# Patient Record
Sex: Female | Born: 1980 | Hispanic: No | Marital: Married | Smoking: Never smoker
Health system: Southern US, Community
[De-identification: ages and names within clinical notes are randomized; demographics above are authoritative.]

## PROBLEM LIST (undated history)

## (undated) HISTORY — PX: TONSILLECTOMY AND ADENOIDECTOMY: SHX28

## (undated) HISTORY — PX: UMBILICAL HERNIA REPAIR: SHX196

## (undated) HISTORY — PX: APPENDECTOMY: SHX54

---

## 2007-03-06 ENCOUNTER — Encounter (INDEPENDENT_AMBULATORY_CARE_PROVIDER_SITE_OTHER): Payer: Self-pay | Admitting: Urology

## 2007-03-06 ENCOUNTER — Ambulatory Visit (HOSPITAL_BASED_OUTPATIENT_CLINIC_OR_DEPARTMENT_OTHER): Admission: RE | Admit: 2007-03-06 | Discharge: 2007-03-06 | Payer: Self-pay | Admitting: Urology

## 2010-03-21 HISTORY — DX: Maternal care for unspecified type scar from previous cesarean delivery: O34.219

## 2010-08-03 NOTE — Op Note (Signed)
NAME:  Donna Russo, Donna Russo NO.:  [id number]   MEDICAL RECORD NO.:  [id number]          PATIENT TYPE:  AMB   LOCATION:  [facility]                         FACILITY:  [facility]   PHYSICIAN:  Jamison Neighbor, M.D.  DATE OF BIRTH:  05/06/1980   DATE OF PROCEDURE:  03/06/2007  DATE OF DISCHARGE:                               OPERATIVE REPORT   PREOPERATIVE DIAGNOSIS:  Urgency, frequency, rule out interstitial  cystitis.   POSTOPERATIVE DIAGNOSIS:  Urgency, frequency, rule out interstitial  cystitis.   PROCEDURE:  Cystoscopy, urethral calibration, hydrodistention of the  bladder, Marcaine and Pyridium instillation, Marcaine and Kenalog  injection, bladder biopsy with cauterization.   SURGEON:  Jamison Neighbor, M.D.   ANESTHESIA:  General.   COMPLICATIONS:  None.   DRAINS:  None.   BRIEF HISTORY:  This 30 year old female has had problems with lower  urinary tract symptoms for quite some time.  She has burning with  urination and sometimes some hesitancy.  The patient says that she feels  that she has frequent urinary tract infections despite the fact that she  has used both antibiotic prophylaxis and takes antibiotics regularly for  adult acne.  The patient says that when she is on antibiotics, she feels  better but she has also stated that she is not sure that one of the  antibiotics worked particularly well for her.  It is not at all clear if  she actually ever had recurrent urinary tract infections.  She was seen  by Dr. Gala Lewandowsky over at [facility], who told her that he thought she had  pelvic floor dysfunction and felt that she had had problems with  emptying and suggest that she may want to be tested for the InterStim.  She is convinced that she might have interstitial cystitis and would  like to have that evaluated.  She understands the risks and benefits of  the procedure and gave full informed consent.   PROCEDURE:  After successful induction of general anesthesia  the patient  was placed in the dorsal lithotomy position, prepped with Betadine and  draped in the usual sterile fashion.  Careful bimanual examination  revealed no cystocele, rectocele, enterocele.  There were no masses on  bimanual exam.  The uterus was palpably normal and in normal position.  The urethra was calibrated 32-French with female urethral sounds with no  evidence of stenosis or stricture.  The cystoscope was inserted.  The  bladder was carefully inspected.  No tumors or stones could be seen.  Both ureteral orifices were normal in configuration and location.  Hydrodistention of the bladder was performed.  The bladder was distended  at a pressure of 100 cm of water for 5 minutes.  When the bladder was  drained, minimal glomerulations could be seen.  Bladder capacity of 1100  mL was felt to be normal.  Bladder biopsy was done, will be sent for  mast cell analysis.  The bladder biopsy site was cauterized.  A mixture  of Marcaine and Pyridium was left in the bladder.  Marcaine and Kenalog  were injected periurethrally.  The patient tolerated the procedure well,  was taken to recovery in good condition.  Unless the patient has  remarkable response to the hydrodistention or has  significant findings on the biopsy, this would really be felt to be not  consistent with interstitial cystitis and she will be told she really  needs to be treated somewhat empirically for frequency and urgency.  Options would include anticholinergic therapy, urinary analgesics,  physical therapy, InterStim and possibly Botox if all that fails.      Jamison Neighbor, M.D.  Electronically Signed     RJE/MEDQ  D:  03/06/2007  T:  03/07/2007  Job:  [id number]

## 2010-12-24 LAB — POCT PREGNANCY, URINE: Preg Test, Ur: NEGATIVE

## 2011-02-22 DIAGNOSIS — O34219 Maternal care for unspecified type scar from previous cesarean delivery: Secondary | ICD-10-CM

## 2011-03-21 DIAGNOSIS — I4719 Other supraventricular tachycardia: Secondary | ICD-10-CM

## 2011-03-21 HISTORY — DX: Other supraventricular tachycardia: I47.19

## 2013-12-11 DIAGNOSIS — N3010 Interstitial cystitis (chronic) without hematuria: Secondary | ICD-10-CM

## 2013-12-11 HISTORY — DX: Interstitial cystitis (chronic) without hematuria: N30.10

## 2014-07-03 DIAGNOSIS — Z87440 Personal history of urinary (tract) infections: Secondary | ICD-10-CM

## 2014-07-03 HISTORY — DX: Personal history of urinary (tract) infections: Z87.440

## 2015-08-14 ENCOUNTER — Other Ambulatory Visit: Payer: Self-pay | Admitting: Obstetrics and Gynecology

## 2015-08-14 ENCOUNTER — Other Ambulatory Visit (HOSPITAL_COMMUNITY)
Admission: RE | Admit: 2015-08-14 | Discharge: 2015-08-14 | Disposition: A | Discharge status: Home / Self Care | Payer: Self-pay | Source: Ambulatory Visit | Attending: Obstetrics and Gynecology | Admitting: Obstetrics and Gynecology

## 2015-08-14 DIAGNOSIS — Z01419 Encounter for gynecological examination (general) (routine) without abnormal findings: Secondary | ICD-10-CM | POA: Insufficient documentation

## 2015-08-14 DIAGNOSIS — Z1151 Encounter for screening for human papillomavirus (HPV): Secondary | ICD-10-CM | POA: Insufficient documentation

## 2015-08-18 LAB — CYTOLOGY - PAP

## 2015-09-20 ENCOUNTER — Encounter (HOSPITAL_COMMUNITY): Payer: Self-pay

## 2015-09-20 ENCOUNTER — Emergency Department (HOSPITAL_COMMUNITY)
Admission: EM | Admit: 2015-09-20 | Discharge: 2015-09-20 | Disposition: A | Discharge status: Home / Self Care | Payer: Self-pay | Attending: Emergency Medicine | Admitting: Emergency Medicine

## 2015-09-20 DIAGNOSIS — R197 Diarrhea, unspecified: Secondary | ICD-10-CM | POA: Insufficient documentation

## 2015-09-20 DIAGNOSIS — R112 Nausea with vomiting, unspecified: Secondary | ICD-10-CM | POA: Insufficient documentation

## 2015-09-20 LAB — CBC
HCT: 42 % (ref 36.0–46.0)
HEMOGLOBIN: 14.4 g/dL (ref 12.0–15.0)
MCH: 30.1 pg (ref 26.0–34.0)
MCHC: 34.3 g/dL (ref 30.0–36.0)
MCV: 87.9 fL (ref 78.0–100.0)
PLATELETS: 178 10*3/uL (ref 150–400)
RBC: 4.78 MIL/uL (ref 3.87–5.11)
RDW: 13.3 % (ref 11.5–15.5)
WBC: 9.4 10*3/uL (ref 4.0–10.5)

## 2015-09-20 LAB — COMPREHENSIVE METABOLIC PANEL
ALK PHOS: 46 U/L (ref 38–126)
ALT: 21 U/L (ref 14–54)
ANION GAP: 10 (ref 5–15)
AST: 26 U/L (ref 15–41)
Albumin: 4.8 g/dL (ref 3.5–5.0)
BUN: 12 mg/dL (ref 6–20)
CALCIUM: 9.7 mg/dL (ref 8.9–10.3)
CO2: 23 mmol/L (ref 22–32)
CREATININE: 0.82 mg/dL (ref 0.44–1.00)
Chloride: 107 mmol/L (ref 101–111)
Glucose, Bld: 128 mg/dL — ABNORMAL HIGH (ref 65–99)
Potassium: 3.8 mmol/L (ref 3.5–5.1)
Sodium: 140 mmol/L (ref 135–145)
Total Bilirubin: 0.5 mg/dL (ref 0.3–1.2)
Total Protein: 8.1 g/dL (ref 6.5–8.1)

## 2015-09-20 LAB — LIPASE, BLOOD: LIPASE: 42 U/L (ref 11–51)

## 2015-09-20 LAB — I-STAT BETA HCG BLOOD, ED (MC, WL, AP ONLY)

## 2015-09-20 MED ORDER — OMEPRAZOLE 20 MG PO CPDR: 20.0000 mg | DELAYED_RELEASE_CAPSULE | Freq: Every day | ORAL | Status: DC

## 2015-09-20 MED ORDER — HYDROCODONE-ACETAMINOPHEN 5-325 MG PO TABS: 2.0000 | ORAL_TABLET | ORAL | Status: DC | PRN

## 2015-09-20 MED ORDER — SODIUM CHLORIDE 0.9 % IV BOLUS (SEPSIS)
1000.0000 mL | Freq: Once | INTRAVENOUS | Status: AC
  Administered 2015-09-20: 1000 mL via INTRAVENOUS

## 2015-09-20 MED ORDER — HYDROMORPHONE HCL 1 MG/ML IJ SOLN
0.5000 mg | Freq: Once | INTRAMUSCULAR | Status: AC
  Administered 2015-09-20: 0.5 mg via INTRAVENOUS
  Filled 2015-09-20: qty 1

## 2015-09-20 MED ORDER — ONDANSETRON HCL 4 MG/2ML IJ SOLN
4.0000 mg | Freq: Once | INTRAMUSCULAR | Status: AC
  Administered 2015-09-20: 4 mg via INTRAVENOUS
  Filled 2015-09-20: qty 2

## 2015-09-20 MED ORDER — FAMOTIDINE IN NACL 20-0.9 MG/50ML-% IV SOLN
20.0000 mg | Freq: Once | INTRAVENOUS | Status: AC
  Administered 2015-09-20: 20 mg via INTRAVENOUS
  Filled 2015-09-20: qty 50

## 2015-09-20 MED ORDER — PROMETHAZINE HCL 25 MG/ML IJ SOLN
25.0000 mg | Freq: Once | INTRAMUSCULAR | Status: AC
  Administered 2015-09-20: 25 mg via INTRAVENOUS
  Filled 2015-09-20: qty 1

## 2015-09-20 MED ORDER — PROMETHAZINE HCL 25 MG PO TABS
25.0000 mg | ORAL_TABLET | Freq: Four times a day (QID) | ORAL | Status: DC | PRN
Start: 2015-09-20 — End: 2015-09-24

## 2015-09-20 NOTE — ED Provider Notes (Signed)
CSN: [id number]     Arrival date & time 09/20/15  0536 History   First MD Initiated Contact with Patient 09/20/15 0602     Chief Complaint  Patient presents with  . Abdominal Pain  . Emesis  . Diarrhea   HPI Comments: 35 year old female with no significant PMH presents with abdominal pain, N/V/D for the past 2 days. She states the N/V started after she at lunch Friday. She thought she may have food poisoning however her symptoms continued. The pain is in the epigastric area and gets better after she vomits however soon returns. It is a constant, achy, dull pain. 8/10. She reports associated decreased urine due to inability to tolerate PO. She denies fever, chest pain, SOB, flank pain, blood in vomit or stool, irritative voiding symptoms. She states she has been using Phenergan suppositories and Zofran prescribed by her PCP with no relief.   Patient is a 35 y.o. female presenting with abdominal pain, vomiting, and diarrhea.  Abdominal Pain Associated symptoms: diarrhea, nausea and vomiting   Associated symptoms: no chest pain, no chills, no dysuria, no fever, no hematuria and no shortness of breath   Emesis Associated symptoms: abdominal pain and diarrhea   Associated symptoms: no chills   Diarrhea Associated symptoms: abdominal pain and vomiting   Associated symptoms: no chills and no fever     History reviewed. No pertinent past medical history. Past Surgical History  Procedure Laterality Date  . Appendectomy     No family history on file. Social History  Substance Use Topics  . Smoking status: Never Smoker   . Smokeless tobacco: None  . Alcohol Use: No   OB History    No data available     Review of Systems  Constitutional: Negative for fever and chills.  Respiratory: Negative for shortness of breath.   Cardiovascular: Negative for chest pain.  Gastrointestinal: Positive for nausea, vomiting, abdominal pain and diarrhea. Negative for blood in stool.  Genitourinary:  Positive for decreased urine volume. Negative for dysuria, hematuria and flank pain.  All other systems reviewed and are negative.   Allergies  Ciprofloxacin  Home Medications   Prior to Admission medications   Not on File   BP 121/86 mmHg  Pulse 60  Temp(Src) 98.4 F (36.9 C) (Oral)  Resp 21  Ht 5\' 2"  (1.575 m)  Wt 55.339 kg  BMI 22.31 kg/m2  SpO2 100%  LMP 09/07/2015 (Approximate)   Physical Exam  Constitutional: She is oriented to person, place, and time. She appears well-developed and well-nourished. No distress.  Appears fatigued  HENT:  Head: Normocephalic and atraumatic.  Eyes: Conjunctivae are normal. Pupils are equal, round, and reactive to light. Right eye exhibits no discharge. Left eye exhibits no discharge. No scleral icterus.  Neck: Normal range of motion.  Cardiovascular: Normal rate and regular rhythm.  Exam reveals no gallop and no friction rub.   No murmur heard. Pulmonary/Chest: Effort normal and breath sounds normal. No respiratory distress. She has no wheezes. She has no rales. She exhibits no tenderness.  Abdominal: Soft. Bowel sounds are normal. She exhibits no distension and no mass. There is tenderness. There is no rebound, no guarding and no CVA tenderness.  Epigastric tenderness  Neurological: She is alert and oriented to person, place, and time.  Skin: Skin is warm and dry. She is not diaphoretic.  Psychiatric: She has a normal mood and affect. Her behavior is normal.    ED Course  Procedures (including critical  care time) Labs Review Labs Reviewed  COMPREHENSIVE METABOLIC PANEL - Abnormal; Notable for the following:    Glucose, Bld 128 (*)    All other components within normal limits  LIPASE, BLOOD  CBC  I-STAT BETA HCG BLOOD, ED ([facility], [facility], [facility] ONLY)    Imaging Review No results found. I have personally reviewed and evaluated these images and lab results as part of my medical decision-making.   EKG Interpretation None     Meds  given in ED:  Medications  ondansetron (ZOFRAN) injection 4 mg (4 mg Intravenous Given 09/20/15 0623)  sodium chloride 0.9 % bolus 1,000 mL (0 mLs Intravenous Stopped 09/20/15 0730)  HYDROmorphone (DILAUDID) injection 0.5 mg (0.5 mg Intravenous Given 09/20/15 0637)  sodium chloride 0.9 % bolus 1,000 mL (0 mLs Intravenous Stopped 09/20/15 1159)  famotidine (PEPCID) IVPB 20 mg premix (0 mg Intravenous Stopped 09/20/15 0907)  HYDROmorphone (DILAUDID) injection 0.5 mg (0.5 mg Intravenous Given 09/20/15 0857)  ondansetron (ZOFRAN) injection 4 mg (4 mg Intravenous Given 09/20/15 0857)  promethazine (PHENERGAN) injection 25 mg (25 mg Intravenous Given 09/20/15 1033)    Discharge Medication List as of 09/20/2015 11:33 AM    START taking these medications   Details  HYDROcodone-acetaminophen (NORCO/VICODIN) 5-325 MG tablet Take 2 tablets by mouth every 4 (four) hours as needed., Starting 09/20/2015, Until Discontinued, Print    omeprazole (PRILOSEC) 20 MG capsule Take 1 capsule (20 mg total) by mouth daily., Starting 09/20/2015, Until Discontinued, Print    promethazine (PHENERGAN) 25 MG tablet Take 1 tablet (25 mg total) by mouth every 6 (six) hours as needed for nausea or vomiting., Starting 09/20/2015, Until Discontinued, Print         MDM   Final diagnoses:  Nausea vomiting and diarrhea   35 year old female presents with acute abdominal pain, N/V/D. Patient appears very uncomfortable upon initial assessment and does not tolerate abdominal exam. IVF, Zofran, Dilaudid given with significant relief. Patient has moderate epigastric tenderness on repeat exam. Likely this is viral illness which has been exacerbated by repeated episodes of dry heaving and vomiting. CBC and CMP are unremarkable. Lipase normal. UA clean.   On recheck patient states her pain and nausea are worsening. Repeat dose of Dilaudid and Zofran given. Pepcid given for GI upset.  2nd recheck patient states pain is better but nausea is still  there. Phenergan given with good relief. Patient tolerate PO challenge. Patient is safe for d/c. Will d/c with norco, prilosec, and phenergan. Patient is NAD, non-toxic, with stable VS. Patient is informed of clinical course, understands medical decision making process, and agrees with plan. Opportunity for questions provided and all questions answered. Return precautions given.     Bethel BornKelly Marie Coren Sagan, PA-C 09/21/15 [id number]  Melene Planan Floyd, DO 09/22/15 [id number]

## 2015-09-20 NOTE — ED Notes (Signed)
Patient c/o vomiting and diarrhea x2 days.  Patient states is not able to keep food or fluids down.  Patient c/o abdominal pain just below her rib cage.  Patient denies blood in emesis or stool.  Patient rates pain 8/10

## 2015-09-21 ENCOUNTER — Emergency Department (HOSPITAL_COMMUNITY): Payer: Self-pay

## 2015-09-21 ENCOUNTER — Inpatient Hospital Stay (HOSPITAL_COMMUNITY)
Admission: EM | Admit: 2015-09-21 | Discharge: 2015-09-24 | DRG: 445 | Disposition: A | Discharge status: Home / Self Care | Payer: Self-pay | Attending: Family Medicine | Admitting: Family Medicine

## 2015-09-21 ENCOUNTER — Encounter (HOSPITAL_COMMUNITY): Payer: Self-pay | Admitting: *Deleted

## 2015-09-21 DIAGNOSIS — R1011 Right upper quadrant pain: Secondary | ICD-10-CM

## 2015-09-21 DIAGNOSIS — I471 Supraventricular tachycardia, unspecified: Secondary | ICD-10-CM | POA: Diagnosis present

## 2015-09-21 DIAGNOSIS — Z881 Allergy status to other antibiotic agents status: Secondary | ICD-10-CM

## 2015-09-21 DIAGNOSIS — Z79899 Other long term (current) drug therapy: Secondary | ICD-10-CM

## 2015-09-21 DIAGNOSIS — K920 Hematemesis: Secondary | ICD-10-CM

## 2015-09-21 DIAGNOSIS — R112 Nausea with vomiting, unspecified: Secondary | ICD-10-CM | POA: Diagnosis present

## 2015-09-21 DIAGNOSIS — K5900 Constipation, unspecified: Secondary | ICD-10-CM | POA: Diagnosis present

## 2015-09-21 DIAGNOSIS — R600 Localized edema: Secondary | ICD-10-CM | POA: Diagnosis present

## 2015-09-21 DIAGNOSIS — K819 Cholecystitis, unspecified: Principal | ICD-10-CM | POA: Diagnosis present

## 2015-09-21 DIAGNOSIS — R1013 Epigastric pain: Secondary | ICD-10-CM

## 2015-09-21 HISTORY — DX: Epigastric pain: R10.13

## 2015-09-21 HISTORY — DX: Hematemesis: K92.0

## 2015-09-21 HISTORY — DX: Constipation, unspecified: K59.00

## 2015-09-21 HISTORY — DX: Nausea with vomiting, unspecified: R11.2

## 2015-09-21 HISTORY — DX: Supraventricular tachycardia, unspecified: I47.10

## 2015-09-21 LAB — COMPREHENSIVE METABOLIC PANEL
ALK PHOS: 44 U/L (ref 38–126)
ALT: 24 U/L (ref 14–54)
AST: 28 U/L (ref 15–41)
Albumin: 5.1 g/dL — ABNORMAL HIGH (ref 3.5–5.0)
Anion gap: 8 (ref 5–15)
BUN: 8 mg/dL (ref 6–20)
CALCIUM: 9.4 mg/dL (ref 8.9–10.3)
CO2: 28 mmol/L (ref 22–32)
CREATININE: 0.87 mg/dL (ref 0.44–1.00)
Chloride: 103 mmol/L (ref 101–111)
Glucose, Bld: 91 mg/dL (ref 65–99)
Potassium: 3.7 mmol/L (ref 3.5–5.1)
Sodium: 139 mmol/L (ref 135–145)
TOTAL PROTEIN: 8.1 g/dL (ref 6.5–8.1)
Total Bilirubin: 0.6 mg/dL (ref 0.3–1.2)

## 2015-09-21 LAB — CBC WITH DIFFERENTIAL/PLATELET
Basophils Absolute: 0 10*3/uL (ref 0.0–0.1)
Basophils Relative: 0 %
EOS PCT: 1 %
Eosinophils Absolute: 0.1 10*3/uL (ref 0.0–0.7)
HCT: 40.9 % (ref 36.0–46.0)
HEMOGLOBIN: 14.7 g/dL (ref 12.0–15.0)
LYMPHS ABS: 1.2 10*3/uL (ref 0.7–4.0)
LYMPHS PCT: 17 %
MCH: 30.9 pg (ref 26.0–34.0)
MCHC: 35.9 g/dL (ref 30.0–36.0)
MCV: 86.1 fL (ref 78.0–100.0)
MONOS PCT: 6 %
Monocytes Absolute: 0.4 10*3/uL (ref 0.1–1.0)
NEUTROS PCT: 76 %
Neutro Abs: 5.3 10*3/uL (ref 1.7–7.7)
Platelets: 158 10*3/uL (ref 150–400)
RBC: 4.75 MIL/uL (ref 3.87–5.11)
RDW: 13.1 % (ref 11.5–15.5)
WBC: 6.9 10*3/uL (ref 4.0–10.5)

## 2015-09-21 LAB — POC OCCULT BLOOD, ED: FECAL OCCULT BLD: NEGATIVE

## 2015-09-21 LAB — LIPASE, BLOOD: LIPASE: 33 U/L (ref 11–51)

## 2015-09-21 MED ORDER — ONDANSETRON HCL 4 MG/2ML IJ SOLN
4.0000 mg | Freq: Four times a day (QID) | INTRAMUSCULAR | Status: DC | PRN
  Administered 2015-09-21 – 2015-09-23 (×5): 4 mg via INTRAVENOUS
  Filled 2015-09-21 (×6): qty 2

## 2015-09-21 MED ORDER — MORPHINE SULFATE (PF) 4 MG/ML IV SOLN
4.0000 mg | INTRAVENOUS | Status: DC | PRN
  Administered 2015-09-21 – 2015-09-24 (×9): 4 mg via INTRAVENOUS
  Filled 2015-09-21 (×10): qty 1

## 2015-09-21 MED ORDER — DIATRIZOATE MEGLUMINE & SODIUM 66-10 % PO SOLN: 15.0000 mL | Freq: Once | ORAL | Status: DC

## 2015-09-21 MED ORDER — ONDANSETRON HCL 4 MG/2ML IJ SOLN
4.0000 mg | Freq: Once | INTRAMUSCULAR | Status: AC
  Administered 2015-09-21: 4 mg via INTRAVENOUS
  Filled 2015-09-21: qty 2

## 2015-09-21 MED ORDER — FENTANYL CITRATE (PF) 100 MCG/2ML IJ SOLN
50.0000 ug | Freq: Once | INTRAMUSCULAR | Status: AC
  Administered 2015-09-21: 50 ug via INTRAVENOUS
  Filled 2015-09-21: qty 2

## 2015-09-21 MED ORDER — ACETAMINOPHEN 650 MG RE SUPP: 650.0000 mg | Freq: Four times a day (QID) | RECTAL | Status: DC | PRN

## 2015-09-21 MED ORDER — ONDANSETRON HCL 4 MG PO TABS: 4.0000 mg | ORAL_TABLET | Freq: Four times a day (QID) | ORAL | Status: DC | PRN

## 2015-09-21 MED ORDER — FAMOTIDINE IN NACL 20-0.9 MG/50ML-% IV SOLN
20.0000 mg | Freq: Two times a day (BID) | INTRAVENOUS | Status: DC
  Administered 2015-09-21: 20 mg via INTRAVENOUS
  Filled 2015-09-21: qty 50

## 2015-09-21 MED ORDER — ENOXAPARIN SODIUM 40 MG/0.4ML ~~LOC~~ SOLN
40.0000 mg | SUBCUTANEOUS | Status: DC
Start: 2015-09-21 — End: 2015-09-24
  Administered 2015-09-21 – 2015-09-23 (×3): 40 mg via SUBCUTANEOUS
  Filled 2015-09-21 (×3): qty 0.4

## 2015-09-21 MED ORDER — FAMOTIDINE IN NACL 20-0.9 MG/50ML-% IV SOLN
20.0000 mg | Freq: Two times a day (BID) | INTRAVENOUS | Status: DC
Start: 2015-09-22 — End: 2015-09-24
  Administered 2015-09-22 – 2015-09-24 (×5): 20 mg via INTRAVENOUS
  Filled 2015-09-21 (×5): qty 50

## 2015-09-21 MED ORDER — POLYETHYLENE GLYCOL 3350 17 G PO PACK: 17.0000 g | PACK | Freq: Every day | ORAL | Status: DC | PRN

## 2015-09-21 MED ORDER — SODIUM CHLORIDE 0.9 % IV SOLN
INTRAVENOUS | Status: DC
  Administered 2015-09-21: 17:00:00 via INTRAVENOUS

## 2015-09-21 MED ORDER — ACETAMINOPHEN 325 MG PO TABS
650.0000 mg | ORAL_TABLET | Freq: Four times a day (QID) | ORAL | Status: DC | PRN
  Administered 2015-09-22: 650 mg via ORAL
  Filled 2015-09-21 (×2): qty 2

## 2015-09-21 MED ORDER — SODIUM CHLORIDE 0.9 % IV BOLUS (SEPSIS)
1000.0000 mL | Freq: Once | INTRAVENOUS | Status: AC
  Administered 2015-09-21: 1000 mL via INTRAVENOUS

## 2015-09-21 MED ORDER — IOPAMIDOL (ISOVUE-300) INJECTION 61%
100.0000 mL | Freq: Once | INTRAVENOUS | Status: AC | PRN
  Administered 2015-09-21: 100 mL via INTRAVENOUS

## 2015-09-21 MED ORDER — SODIUM CHLORIDE 0.9 % IV SOLN
INTRAVENOUS | Status: DC
  Administered 2015-09-21: 20:00:00 via INTRAVENOUS

## 2015-09-21 NOTE — ED Notes (Signed)
Patient transported to Ultrasound 

## 2015-09-21 NOTE — ED Notes (Signed)
Report given-transfer to 3rd floor 

## 2015-09-21 NOTE — ED Provider Notes (Signed)
CSN: [id number]     Arrival date & time 09/21/15  1237 History   First MD Initiated Contact with Patient 09/21/15 1439     Chief Complaint  Patient presents with  . Emesis  . Abdominal Pain      HPI  Expand All Collapse All   Pt complains of emesis and abdominal pain since Friday. Pt was seen at [facility] yesterday and sent home with phenergan and hydrocodone, which she states she has been unable to keep down since this morning. Pt states she has coffee ground emesis this morning, pt states she was unable to keep nexium down this morning.        History reviewed. No pertinent past medical history. Past Surgical History  Procedure Laterality Date  . Appendectomy     No family history on file. Social History  Substance Use Topics  . Smoking status: Never Smoker   . Smokeless tobacco: None  . Alcohol Use: No   OB History    No data available     Review of Systems  Gastrointestinal: Positive for nausea, vomiting and abdominal pain.  All other systems reviewed and are negative.     Allergies  Ciprofloxacin  Home Medications   Prior to Admission medications   Medication Sig Start Date End Date Taking? Authorizing Provider  HYDROcodone-acetaminophen (NORCO/VICODIN) 5-325 MG tablet Take 2 tablets by mouth every 4 (four) hours as needed. Patient taking differently: Take 2 tablets by mouth every 4 (four) hours as needed for moderate pain or severe pain.  09/20/15  Yes Bethel BornKelly Marie Gekas, PA-C  hydrOXYzine (ATARAX/VISTARIL) 25 MG tablet Take 25 mg by mouth at bedtime as needed for nausea.   Yes Historical Provider, MD  nitrofurantoin (MACRODANTIN) 25 MG capsule Take 25 mg by mouth at bedtime.   Yes Historical Provider, MD  ondansetron (ZOFRAN) 4 MG tablet Take 4 mg by mouth every 8 (eight) hours as needed for nausea or vomiting.   Yes Historical Provider, MD  promethazine (PHENERGAN) 25 MG tablet Take 1 tablet (25 mg total) by mouth every 6 (six) hours as needed for nausea or  vomiting. 09/20/15  Yes Bethel BornKelly Marie Gekas, PA-C  omeprazole (PRILOSEC) 20 MG capsule Take 1 capsule (20 mg total) by mouth daily. Patient not taking: Reported on 09/21/2015 09/20/15   Bethel BornKelly Marie Gekas, PA-C   BP 118/78 mmHg  Pulse 64  Temp(Src) 98.6 F (37 C) (Oral)  Resp 16  SpO2 100%  LMP 09/07/2015 (Approximate) Physical Exam  Constitutional: She is oriented to person, place, and time. She appears well-developed and well-nourished. No distress.  HENT:  Head: Normocephalic and atraumatic.  Eyes: Pupils are equal, round, and reactive to light.  Neck: Normal range of motion.  Cardiovascular: Normal rate and intact distal pulses.   Pulmonary/Chest: No respiratory distress.  Abdominal: Normal appearance. She exhibits no distension. There is tenderness in the epigastric area. There is no rebound and no guarding.  Musculoskeletal: Normal range of motion.  Neurological: She is alert and oriented to person, place, and time. No cranial nerve deficit.  Skin: Skin is warm and dry. No rash noted.  Psychiatric: She has a normal mood and affect. Her behavior is normal.  Nursing note and vitals reviewed.   ED Course  Procedures (including critical care time) Medications  0.9 %  sodium chloride infusion ( Intravenous New Bag/Given 09/21/15 1705)  famotidine (PEPCID) IVPB 20 mg premix (20 mg Intravenous New Bag/Given 09/21/15 1749)  ondansetron (ZOFRAN) injection 4 mg (4 mg  Intravenous Given 09/21/15 1506)  sodium chloride 0.9 % bolus 1,000 mL (0 mLs Intravenous Stopped 09/21/15 1615)  fentaNYL (SUBLIMAZE) injection 50 mcg (50 mcg Intravenous Given 09/21/15 1506)  iopamidol (ISOVUE-300) 61 % injection 100 mL (100 mLs Intravenous Contrast Given 09/21/15 1542)  fentaNYL (SUBLIMAZE) injection 50 mcg (50 mcg Intravenous Given 09/21/15 1705)  ondansetron (ZOFRAN) injection 4 mg (4 mg Intravenous Given 09/21/15 1706)    Labs Review Labs Reviewed  COMPREHENSIVE METABOLIC PANEL - Abnormal; Notable for the following:     Albumin 5.1 (*)    All other components within normal limits  CBC WITH DIFFERENTIAL/PLATELET  LIPASE, BLOOD  POC OCCULT BLOOD, ED    Imaging Review Ct Abdomen Pelvis W Contrast  09/21/2015  CLINICAL DATA:  Emesis and RIGHT lower quadrant pain since Friday, hematemesis, prior appendectomy EXAM: CT ABDOMEN AND PELVIS WITH CONTRAST TECHNIQUE: Multidetector CT imaging of the abdomen and pelvis was performed using the standard protocol following bolus administration of intravenous contrast. Sagittal and coronal MPR images reconstructed from axial data set. CONTRAST:  100mL ISOVUE-300 IOPAMIDOL (ISOVUE-300) INJECTION 61% IV. No oral contrast administered. COMPARISON:  None FINDINGS: Lower chest:  Lung bases clear Hepatobiliary: Liver and gallbladder normal appearance Pancreas: Normal appearance Spleen: Normal appearance Adrenals/Urinary Tract: Adrenal glands, kidneys, ureters, and bladder normal appearance. No urinary tract calcification or dilatation. Stomach/Bowel: Appendix surgically absent by history. Suboptimal assessment of bowel loops due the lack of IV contrast. Minimal edema within mesenteric in RIGHT mid abdomen. No definite obstruction, bowel dilatation or bowel wall thickening. Vascular/Lymphatic: Major vascular structures grossly patent. Scattered normal size mesenteric nodes without definite abdominal or pelvic adenopathy. Reproductive: Unremarkable uterus and LEFT ovary. Probable partially collapsed cyst in RIGHT ovary 20 x 15 x 15 mm with free fluid in pelvis. Other: No free air or hernia. Musculoskeletal: Osseous structures unremarkable. IMPRESSION: Small amount of low-attenuation free pelvic fluid with question ruptured/collapsed cyst in RIGHT ovary 20 x 15 x 15 mm in size. Minimal scattered nonspecific edema within mesentery in the upper RIGHT pelvis. No other definite abdominal or pelvic abnormalities. Electronically Signed   By: Ulyses SouthwardMark  Boles M.D.   On: 09/21/2015 16:04   I have personally  reviewed and evaluated these images and lab results as part of my medical decision-making.    MDM   Final diagnoses:  Non-intractable vomiting with nausea, vomiting of unspecified type  Coffee ground emesis        Nelva Nayobert Maleeah Crossman, MD 09/21/15 1754

## 2015-09-21 NOTE — ED Notes (Signed)
Off floor for testing 

## 2015-09-21 NOTE — H&P (Signed)
History and Physical  Donna Russo ZOX:[id number] DOB: 1980-09-10 DOA: 09/21/2015  Referring physician: Maryella Shivers, ER physician  PCP: No primary care provider on file.  Outpatient Specialists: None Patient coming from: Home & is able to ambulate without assistance  Chief Complaint: Epigastric pain   HPI: Donna Russo is a 35 y.o. female with medical history significant constipation and paroxysmal SVT who was in her usual state of health when starting Friday evening after eating some pizza, she started experiencing sudden onset of midepigastric pain followed by relentless nausea, vomiting and diarrhea. Midepigastric pain was described as 10/10 and for the most part nonradiating, although at times, she felt like it might go to her right upper quadrant and almost to her back. By Sunday 7/2, diarrhea had mostly resolved, but epigastric pain with nausea and vomiting continued to persist. It only eased off whenever she was not eating. She was still hungry, but for some site eating, even clear liquids, lead to recurrent symptoms. Patient went to the emergency room on 7/2 where workup was negative including completely normal symmetric, CBC. Patient felt to have viral gastroenteritis and given GI cocktail plus medicine for pain and nausea and sent home.  She initially felt better, but then symptoms occurred whenever medicine would wear off and she would try to eat. She also thought she might have had some minimal coffee ground emesis today so she became more concerned She came back in to the emergency room today 7/3.  ED Course:  In the emergency room, workup again negative. Lipase and liver function tests normal. Abdominal CT normal for liver and pancreas and gallbladder. Patient was noted to have a complex ovarian cyst which looked to be partially collapsed, however this was more in the left pelvic region. A follow-up transvaginal ultrasound confirmed this finding, however this looks to be more  incidental and unrelated to patient's abdominal pain.  Review of Systems: Patient seen after arrival to floor .Pt complains of feeling hungry as well as some mild midepigastric pain, not as bad as she got medicine for pain in the emergency room.  She does complain of chronic constipation on for the past few months, however with MiraLAX, she is able to have bowel movements now.   Pt denies any  headaches, vision changes, dysphagia, chest pain, palpitations, shortness of breath, wheeze, cough, hematuria, dysuria, diarrhea. .  She denies any focal extremity numbness weakness or pain. Review of systems are otherwise negative   History reviewed. No pertinent past medical history. Past Surgical History  Procedure Laterality Date  . Appendectomy      Social History:  reports that she has never smoked. She does not have any smokeless tobacco history on file. She reports that she does not drink alcohol or use illicit drugs.   Allergies  Allergen Reactions  . Ciprofloxacin Rash    Family history: Hypertension   Prior to Admission medications   Medication Sig Start Date End Date Taking? Authorizing Provider  HYDROcodone-acetaminophen (NORCO/VICODIN) 5-325 MG tablet Take 2 tablets by mouth every 4 (four) hours as needed. Patient taking differently: Take 2 tablets by mouth every 4 (four) hours as needed for moderate pain or severe pain.  09/20/15  Yes Bethel Born, PA-C  hydrOXYzine (ATARAX/VISTARIL) 25 MG tablet Take 25 mg by mouth at bedtime as needed for nausea.   Yes Historical Provider, MD  nitrofurantoin (MACRODANTIN) 25 MG capsule Take 25 mg by mouth at bedtime.   Yes Historical Provider, MD  ondansetron (ZOFRAN) 4 MG  tablet Take 4 mg by mouth every 8 (eight) hours as needed for nausea or vomiting.   Yes Historical Provider, MD  promethazine (PHENERGAN) 25 MG tablet Take 1 tablet (25 mg total) by mouth every 6 (six) hours as needed for nausea or vomiting. 09/20/15  Yes Bethel Born,  PA-C  omeprazole (PRILOSEC) 20 MG capsule Take 1 capsule (20 mg total) by mouth daily. Patient not taking: Reported on 09/21/2015 09/20/15   Bethel Born, PA-C    Physical Exam: BP 115/74 mmHg  Pulse 50  Temp(Src) 99.9 F (37.7 C) (Oral)  Resp 16  Ht 5\' 2"  (1.575 m)  Wt 55.7 kg (122 lb 12.7 oz)  BMI 22.45 kg/m2  SpO2 100%  LMP 09/07/2015 (Approximate)  General:  Alert and oriented 3, minimal distress secondary to abdominal pain starting to flare back up  Eyes: Sclera nonicteric, extraocular movements are intact  ENT: Normocephalic, atraumatic, mucous membranes are moist  Neck: No carotid bruits, no JVD Cardiovascular: regular rhythm, borderline tachycardia  Respiratory: clear to auscultation bilaterally  Abdomen: soft, tenderness only in the midepigastric area, nondistended, hypoactive  Skin: no skin breaks, tears or lesions  Musculoskeletal: no clubbing or cyanosis or edema  Psychiatric: patient is appropriate, no evidence of psychoses  Neurologic: no focal deficits, sensation and strength intact symmetrically          Labs on Admission:  Basic Metabolic Panel:  Recent Labs Lab 09/20/15 0616 09/21/15 1520  NA 140 139  K 3.8 3.7  CL 107 103  CO2 23 28  GLUCOSE 128* 91  BUN 12 8  CREATININE 0.82 0.87  CALCIUM 9.7 9.4   Liver Function Tests:  Recent Labs Lab 09/20/15 0616 09/21/15 1520  AST 26 28  ALT 21 24  ALKPHOS 46 44  BILITOT 0.5 0.6  PROT 8.1 8.1  ALBUMIN 4.8 5.1*    Recent Labs Lab 09/20/15 0616 09/21/15 1520  LIPASE 42 33   No results for input(s): AMMONIA in the last 168 hours. CBC:  Recent Labs Lab 09/20/15 0616 09/21/15 1520  WBC 9.4 6.9  NEUTROABS  --  5.3  HGB 14.4 14.7  HCT 42.0 40.9  MCV 87.9 86.1  PLT 178 158   Cardiac Enzymes: No results for input(s): CKTOTAL, CKMB, CKMBINDEX, TROPONINI in the last 168 hours.  BNP (last 3 results) No results for input(s): BNP in the last 8760 hours.  ProBNP (last 3 results) No  results for input(s): PROBNP in the last 8760 hours.  CBG: No results for input(s): GLUCAP in the last 168 hours.  Radiological Exams on Admission: US Transvaginal Non-ob  09/21/2015  CLINICAL DATA:  Right lower quadrant/pelvic pain for 4 days. Ovarian cyst. EXAM: TRANSABDOMINAL AND TRANSVAGINAL ULTRASOUND OF PELVIS TECHNIQUE: Both transabdominal and transvaginal ultrasound examinations of the pelvis were performed. Transabdominal technique was performed for global imaging of the pelvis including uterus, ovaries, adnexal regions, and pelvic cul-de-sac. It was necessary to proceed with endovaginal exam following the transabdominal exam to visualize the left and right ovary. COMPARISON:  CT abdomen/ pelvis earlier this day FINDINGS: Uterus Measurements: 11.5 x 4.4 x 4.5 cm, anteverted and anteflexed. No fibroids or other mass visualized. Nabothian cysts in the cervix. Endometrium Thickness: 7 mm.  No focal abnormality visualized. Right ovary Measurements: 4.5 x 1.4 x 3.1 cm. Normal blood flow seen. There is a probable collapsed complex cyst measuring 2.2 cm corresponding to that seen on CT. Additional physiologic follicles. Left ovary Measurements: 3.0 x 1.7 x 3.0 cm.  Normal appearance/no adnexal mass. Physiologic follicles, normal blood flow. Other findings Small volume pelvic free fluid, likely physiologic. IMPRESSION: Complex cyst in the right ovary measuring 2.2 cm. This is likely a collapsed corpus luteal or hemorrhagic cyst. Given right-sided pelvic symptoms, follow-up ultrasound in 6-12 weeks could be considered, preferably in the week following the patient's normal menses. Normal sonographic appearance of the left ovary and uterus. Electronically Signed   By: Rubye OaksMelanie  Ehinger M.D.   On: 09/21/2015 18:05   [place] Pelvis Complete  09/21/2015  CLINICAL DATA:  Right lower quadrant/pelvic pain for 4 days. Ovarian cyst. EXAM: TRANSABDOMINAL AND TRANSVAGINAL ULTRASOUND OF PELVIS TECHNIQUE: Both transabdominal and  transvaginal ultrasound examinations of the pelvis were performed. Transabdominal technique was performed for global imaging of the pelvis including uterus, ovaries, adnexal regions, and pelvic cul-de-sac. It was necessary to proceed with endovaginal exam following the transabdominal exam to visualize the left and right ovary. COMPARISON:  CT abdomen/ pelvis earlier this day FINDINGS: Uterus Measurements: 11.5 x 4.4 x 4.5 cm, anteverted and anteflexed. No fibroids or other mass visualized. Nabothian cysts in the cervix. Endometrium Thickness: 7 mm.  No focal abnormality visualized. Right ovary Measurements: 4.5 x 1.4 x 3.1 cm. Normal blood flow seen. There is a probable collapsed complex cyst measuring 2.2 cm corresponding to that seen on CT. Additional physiologic follicles. Left ovary Measurements: 3.0 x 1.7 x 3.0 cm. Normal appearance/no adnexal mass. Physiologic follicles, normal blood flow. Other findings Small volume pelvic free fluid, likely physiologic. IMPRESSION: Complex cyst in the right ovary measuring 2.2 cm. This is likely a collapsed corpus luteal or hemorrhagic cyst. Given right-sided pelvic symptoms, follow-up ultrasound in 6-12 weeks could be considered, preferably in the week following the patient's normal menses. Normal sonographic appearance of the left ovary and uterus. Electronically Signed   By: Rubye OaksMelanie  Ehinger M.D.   On: 09/21/2015 18:05   Ct Abdomen Pelvis W Contrast  09/21/2015  CLINICAL DATA:  Emesis and RIGHT lower quadrant pain since Friday, hematemesis, prior appendectomy EXAM: CT ABDOMEN AND PELVIS WITH CONTRAST TECHNIQUE: Multidetector CT imaging of the abdomen and pelvis was performed using the standard protocol following bolus administration of intravenous contrast. Sagittal and coronal MPR images reconstructed from axial data set. CONTRAST:  100mL ISOVUE-300 IOPAMIDOL (ISOVUE-300) INJECTION 61% IV. No oral contrast administered. COMPARISON:  None FINDINGS: Lower chest:  Lung  bases clear Hepatobiliary: Liver and gallbladder normal appearance Pancreas: Normal appearance Spleen: Normal appearance Adrenals/Urinary Tract: Adrenal glands, kidneys, ureters, and bladder normal appearance. No urinary tract calcification or dilatation. Stomach/Bowel: Appendix surgically absent by history. Suboptimal assessment of bowel loops due the lack of IV contrast. Minimal edema within mesenteric in RIGHT mid abdomen. No definite obstruction, bowel dilatation or bowel wall thickening. Vascular/Lymphatic: Major vascular structures grossly patent. Scattered normal size mesenteric nodes without definite abdominal or pelvic adenopathy. Reproductive: Unremarkable uterus and LEFT ovary. Probable partially collapsed cyst in RIGHT ovary 20 x 15 x 15 mm with free fluid in pelvis. Other: No free air or hernia. Musculoskeletal: Osseous structures unremarkable. IMPRESSION: Small amount of low-attenuation free pelvic fluid with question ruptured/collapsed cyst in RIGHT ovary 20 x 15 x 15 mm in size. Minimal scattered nonspecific edema within mesentery in the upper RIGHT pelvis. No other definite abdominal or pelvic abnormalities. Electronically Signed   By: Ulyses SouthwardMark  Boles M.D.   On: 09/21/2015 16:04    EKG: Independently reviewed.  not done   Assessment/Plan Present on Admission:  . Epigastric abdominal pain: Unusual. No evidence of  pancreatitis seen on CT and lipase level normal. Patient seems to be in initially good state of health. Do not think that this is drug seeking. Suspected given complaints of diarrhea in the beginning that she may have had a viral gastroenteritis and then perhaps with forceful nausea and vomiting, question if she had some esophageal tear? For now, we'll treat with IV fluids, clear liquids, IV pain and nausea medication. If she is unchanged in her symptoms, we'll consult GI in the morning.  . Paroxysmal SVT (supraventricular tachycardia) [facility](HCC): Patient has had an EP study already with  decisions to be made as far as further intervention later on  . Constipation: Sounds to be a recent problem although it is now better. Do not think this is a contributor factor given that her biggest complaint is of pain rather than nausea or fullness. Abdominal exam does not show distention. CT exam does not show large amount of stool. We'll check TSH to see if this is contributing factor.  . Nausea with vomiting: As mentioned above, maybe earlier been viral gastroenteritis. We'll check stool cultures  DVT prophylaxis: Lovenox  Code Status: Full code   Family Communication: Husband at the bedside   Disposition Plan: Likely discharge tomorrow if feeling better   Consults called: None for now, if symptoms unchanged by morning, will consult GI   Admission status: Observation   Time spent: 40 minutes   Hollice EspyKRISHNAN,SENDIL K MD [facility] Pager [phone]  If 7PM-7AM, please contact night-coverage [email or web address] Password [email or web address]  09/21/2015, 7:44 PM

## 2015-09-21 NOTE — ED Notes (Signed)
MD @ bedside - RN Starting - IV will draw labs

## 2015-09-21 NOTE — ED Notes (Signed)
Pt complains of emesis/RLQ abdominal pain since Friday. [facility] and sent home with phenergan and hydrocodone, which she states she has been unable to keep down since this morning. Pt states she has coffee ground emesis this morning, pt states she was unable to keep nexium down this morning.

## 2015-09-22 ENCOUNTER — Observation Stay (HOSPITAL_COMMUNITY): Payer: Self-pay

## 2015-09-22 LAB — GLUCOSE, CAPILLARY
Glucose-Capillary: 59 mg/dL — ABNORMAL LOW (ref 65–99)
Glucose-Capillary: 71 mg/dL (ref 65–99)

## 2015-09-22 LAB — TSH: TSH: 2.865 u[IU]/mL (ref 0.350–4.500)

## 2015-09-22 MED ORDER — PROMETHAZINE HCL 25 MG/ML IJ SOLN
12.5000 mg | Freq: Three times a day (TID) | INTRAMUSCULAR | Status: DC | PRN
  Administered 2015-09-22 – 2015-09-23 (×2): 12.5 mg via INTRAVENOUS
  Filled 2015-09-22 (×2): qty 1

## 2015-09-22 MED ORDER — DEXTROSE-NACL 5-0.45 % IV SOLN
INTRAVENOUS | Status: DC
  Administered 2015-09-22 – 2015-09-24 (×4): via INTRAVENOUS

## 2015-09-22 MED ORDER — PROMETHAZINE HCL 25 MG/ML IJ SOLN
12.5000 mg | Freq: Once | INTRAMUSCULAR | Status: AC
Start: 2015-09-22 — End: 2015-09-22
  Administered 2015-09-22: 12.5 mg via INTRAVENOUS
  Filled 2015-09-22: qty 1

## 2015-09-22 NOTE — Progress Notes (Addendum)
History and Physical  Donna Russo QMV:[id number] DOB: 12/25/1980 DOA: 09/21/2015  Subjective: Patient is still a little nauseated, no vomiting since she did in the ER last night, she tolerated breakfast of jellow / juice this morning but still in pain in epigastric/RUQ area. No BM since last night. No diarrhea since Sun. No fever. No other complaints. No vaginal bleeding.    Physical Exam: BP 104/69 mmHg  Pulse 52  Temp(Src) 98.3 F (36.8 C) (Oral)  Resp 16  Ht  (1.575 m)  Wt 55.7 kg (122 lb 12.7 oz)  BMI 22.45 kg/m2  SpO2 100%  LMP 09/07/2015 (Approximate)  GENERAL :   Alert and cooperative, and appears to be in no acute distress. HEAD:           normocephalic. EYES:            PERRL, EOMI.  vision is grossly intact. EARS:           hearing grossly intact. NOSE:           No nasal discharge. THROAT:     Oral cavity and pharynx normal.   NECK:          supple, non-tender.  CARDIAC:    Normal S1 and S2. No gallop. No murmurs.  Vascular:     no peripheral edema. Extremities are warm and well perfused. No carotid bruits. LUNGS:       Clear to auscultation  ABDOMEN: Positive bowel sounds. Soft, nondistended, tender to palpation in RUQ/Epigastric area. No guarding or rebound.      MSK:           No joint erythema or tenderness. EXT           : No significant deformity or joint abnormality. Neuro        : Alert, oriented to person, place, and time.                      CN II-XII intact.                       SKIN:            No rash. No lesions. PSYCH:       No hallucination. Patient is not suicidal.          Labs on Admission:  Reviewed.   Radiological Exams on Admission: US Transvaginal Non-ob  09/21/2015  CLINICAL DATA:  Right lower quadrant/pelvic pain for 4 days. Ovarian cyst. EXAM: TRANSABDOMINAL AND TRANSVAGINAL ULTRASOUND OF PELVIS TECHNIQUE: Both transabdominal and transvaginal ultrasound examinations of the pelvis were performed. Transabdominal  technique was performed for global imaging of the pelvis including uterus, ovaries, adnexal regions, and pelvic cul-de-sac. It was necessary to proceed with endovaginal exam following the transabdominal exam to visualize the left and right ovary. COMPARISON:  CT abdomen/ pelvis earlier this day FINDINGS: Uterus Measurements: 11.5 x 4.4 x 4.5 cm, anteverted and anteflexed. No fibroids or other mass visualized. Nabothian cysts in the cervix. Endometrium Thickness: 7 mm.  No focal abnormality visualized. Right ovary Measurements: 4.5 x 1.4 x 3.1 cm. Normal blood flow seen. There is a probable collapsed complex cyst measuring 2.2 cm corresponding to that seen on CT. Additional physiologic follicles. Left ovary Measurements: 3.0 x 1.7 x 3.0 cm. Normal appearance/no adnexal mass. Physiologic follicles, normal blood flow. Other findings Small volume pelvic free fluid, likely physiologic. IMPRESSION: Complex cyst in the right ovary measuring 2.2 cm. This  is likely a collapsed corpus luteal or hemorrhagic cyst. Given right-sided pelvic symptoms, follow-up ultrasound in 6-12 weeks could be considered, preferably in the week following the patient's normal menses. Normal sonographic appearance of the left ovary and uterus. Electronically Signed   By: Rubye OaksMelanie  Ehinger M.D.   On: 09/21/2015 18:05   [place] Pelvis Complete  09/21/2015  CLINICAL DATA:  Right lower quadrant/pelvic pain for 4 days. Ovarian cyst. EXAM: TRANSABDOMINAL AND TRANSVAGINAL ULTRASOUND OF PELVIS TECHNIQUE: Both transabdominal and transvaginal ultrasound examinations of the pelvis were performed. Transabdominal technique was performed for global imaging of the pelvis including uterus, ovaries, adnexal regions, and pelvic cul-de-sac. It was necessary to proceed with endovaginal exam following the transabdominal exam to visualize the left and right ovary. COMPARISON:  CT abdomen/ pelvis earlier this day FINDINGS: Uterus Measurements: 11.5 x 4.4 x 4.5 cm, anteverted  and anteflexed. No fibroids or other mass visualized. Nabothian cysts in the cervix. Endometrium Thickness: 7 mm.  No focal abnormality visualized. Right ovary Measurements: 4.5 x 1.4 x 3.1 cm. Normal blood flow seen. There is a probable collapsed complex cyst measuring 2.2 cm corresponding to that seen on CT. Additional physiologic follicles. Left ovary Measurements: 3.0 x 1.7 x 3.0 cm. Normal appearance/no adnexal mass. Physiologic follicles, normal blood flow. Other findings Small volume pelvic free fluid, likely physiologic. IMPRESSION: Complex cyst in the right ovary measuring 2.2 cm. This is likely a collapsed corpus luteal or hemorrhagic cyst. Given right-sided pelvic symptoms, follow-up ultrasound in 6-12 weeks could be considered, preferably in the week following the patient's normal menses. Normal sonographic appearance of the left ovary and uterus. Electronically Signed   By: Rubye OaksMelanie  Ehinger M.D.   On: 09/21/2015 18:05   Ct Abdomen Pelvis W Contrast  09/21/2015  CLINICAL DATA:  Emesis and RIGHT lower quadrant pain since Friday, hematemesis, prior appendectomy EXAM: CT ABDOMEN AND PELVIS WITH CONTRAST TECHNIQUE: Multidetector CT imaging of the abdomen and pelvis was performed using the standard protocol following bolus administration of intravenous contrast. Sagittal and coronal MPR images reconstructed from axial data set. CONTRAST:  100mL ISOVUE-300 IOPAMIDOL (ISOVUE-300) INJECTION 61% IV. No oral contrast administered. COMPARISON:  None FINDINGS: Lower chest:  Lung bases clear Hepatobiliary: Liver and gallbladder normal appearance Pancreas: Normal appearance Spleen: Normal appearance Adrenals/Urinary Tract: Adrenal glands, kidneys, ureters, and bladder normal appearance. No urinary tract calcification or dilatation. Stomach/Bowel: Appendix surgically absent by history. Suboptimal assessment of bowel loops due the lack of IV contrast. Minimal edema within mesenteric in RIGHT mid abdomen. No definite  obstruction, bowel dilatation or bowel wall thickening. Vascular/Lymphatic: Major vascular structures grossly patent. Scattered normal size mesenteric nodes without definite abdominal or pelvic adenopathy. Reproductive: Unremarkable uterus and LEFT ovary. Probable partially collapsed cyst in RIGHT ovary 20 x 15 x 15 mm with free fluid in pelvis. Other: No free air or hernia. Musculoskeletal: Osseous structures unremarkable. IMPRESSION: Small amount of low-attenuation free pelvic fluid with question ruptured/collapsed cyst in RIGHT ovary 20 x 15 x 15 mm in size. Minimal scattered nonspecific edema within mesentery in the upper RIGHT pelvis. No other definite abdominal or pelvic abnormalities. Electronically Signed   By: Ulyses SouthwardMark  Boles M.D.   On: 09/21/2015 16:04      Assessment/Plan  . Epigastric abdominal pain:  Unclear etiology to me yet but suspect gastroenteritis vs. Cholecystitis  . No evidence of pancreatitis seen on CT and lipase level normal.   RUQ US with gallbladder wall edema, will check HIDA scan, keep patient NPO. Patient has felt  better but still nauseated. I feel early discharge today may cause her to come back. Will monitor further and check HIDA scan. Re-advance diet in am.    Possible ruptured cyst is symptomatic but less likely to present like this esp with epigastric pain.   . Paroxysmal SVT (supraventricular tachycardia) [facility](HCC): Patient has had an EP study already with decisions to be made as far as further intervention later on  . Constipation: Sounds to be a recent problem although it is now better. Do not think this is a contributor factor given that her biggest complaint is of pain rather than nausea or fullness. Abdominal exam does not show distention. CT exam does not show large amount of stool.   . Nausea with vomiting: As mentioned above, maybe earlier been viral gastroenteritis. We'll check stool cultures. Continue Zofran prn, morphine prn, IVF.  DVT prophylaxis:  Lovenox  Code Status: Full code   Family Communication: none at the bedside   Disposition Plan: Likely discharge later today if feeling better/tolerating PO.  Consults called: None for now, if symptoms unchanged mayconsult GI   Admission status: Observation   Eston EstersAhmad Lai Hendriks M.D [facility]

## 2015-09-23 ENCOUNTER — Inpatient Hospital Stay (HOSPITAL_COMMUNITY): Payer: Self-pay

## 2015-09-23 DIAGNOSIS — R1013 Epigastric pain: Secondary | ICD-10-CM

## 2015-09-23 MED ORDER — TECHNETIUM TC 99M MEBROFENIN IV KIT
5.2000 | PACK | Freq: Once | INTRAVENOUS | Status: AC | PRN
  Administered 2015-09-23: 5.2 via INTRAVENOUS

## 2015-09-23 MED ORDER — LACTULOSE 10 GM/15ML PO SOLN: 10.0000 g | Freq: Every day | ORAL | Status: DC | PRN

## 2015-09-23 NOTE — Progress Notes (Signed)
History and Physical  Vicenta DunningCarrie Crossley RUE:[id number] DOB: 11/19/1980 DOA: 09/21/2015  Subjective: Patient still having nausea and ruq abdominal discomfort   Physical Exam: BP 109/76 mmHg  Pulse 59  Temp(Src) 98.9 F (37.2 C) (Oral)  Resp 14  Ht 5\' 2"  (1.575 m)  Wt 55.7 kg (122 lb 12.7 oz)  BMI 22.45 kg/m2  SpO2 100%  LMP 09/07/2015 (Approximate)  GENERAL :   Alert and cooperative, and appears to be in no acute distress. HEAD:           Normocephalic. atraumatic EYES:            PERRL, EOMI.  vision is grossly intact. EARS:           hearing grossly intact. NOSE:           No nasal discharge. THROAT:     Oral cavity and pharynx normal.   NECK:          supple, non-tender.  CARDIAC:    Normal S1 and S2. No gallop. No murmurs.  Vascular:     no peripheral edema. Extremities are warm and well perfused. No carotid bruits. LUNGS:       Clear to auscultation  ABDOMEN: Positive bowel sounds. Soft, nondistended, tender to palpation in RUQ/Epigastric area. No guarding or rebound.      MSK:           No joint erythema or tenderness. EXT           : No significant deformity or joint abnormality. Neuro        : Alert, oriented to person, place, and time.                      CN 3-XII intact.                       SKIN:            No rash. No lesions. PSYCH:       No hallucination. Patient is not suicidal.          Labs on Admission:  Reviewed.   Radiological Exams on Admission: [place] Transvaginal Non-ob  09/21/2015  CLINICAL DATA:  Right lower quadrant/pelvic pain for 4 days. Ovarian cyst. EXAM: TRANSABDOMINAL AND TRANSVAGINAL ULTRASOUND OF PELVIS TECHNIQUE: Both transabdominal and transvaginal ultrasound examinations of the pelvis were performed. Transabdominal technique was performed for global imaging of the pelvis including uterus, ovaries, adnexal regions, and pelvic cul-de-sac. It was necessary to proceed with endovaginal exam following the transabdominal exam to  visualize the left and right ovary. COMPARISON:  CT abdomen/ pelvis earlier this day FINDINGS: Uterus Measurements: 11.5 x 4.4 x 4.5 cm, anteverted and anteflexed. No fibroids or other mass visualized. Nabothian cysts in the cervix. Endometrium Thickness: 7 mm.  No focal abnormality visualized. Right ovary Measurements: 4.5 x 1.4 x 3.1 cm. Normal blood flow seen. There is a probable collapsed complex cyst measuring 2.2 cm corresponding to that seen on CT. Additional physiologic follicles. Left ovary Measurements: 3.0 x 1.7 x 3.0 cm. Normal appearance/no adnexal mass. Physiologic follicles, normal blood flow. Other findings Small volume pelvic free fluid, likely physiologic. IMPRESSION: Complex cyst in the right ovary measuring 2.2 cm. This is likely a collapsed corpus luteal or hemorrhagic cyst. Given right-sided pelvic symptoms, follow-up ultrasound in 6-12 weeks could be considered, preferably in the week following the patient's normal menses. Normal sonographic appearance of the left ovary and uterus. Electronically Signed  By: Rubye OaksMelanie  Ehinger M.D.   On: 09/21/2015 18:05   [place] Pelvis Complete  09/21/2015  CLINICAL DATA:  Right lower quadrant/pelvic pain for 4 days. Ovarian cyst. EXAM: TRANSABDOMINAL AND TRANSVAGINAL ULTRASOUND OF PELVIS TECHNIQUE: Both transabdominal and transvaginal ultrasound examinations of the pelvis were performed. Transabdominal technique was performed for global imaging of the pelvis including uterus, ovaries, adnexal regions, and pelvic cul-de-sac. It was necessary to proceed with endovaginal exam following the transabdominal exam to visualize the left and right ovary. COMPARISON:  CT abdomen/ pelvis earlier this day FINDINGS: Uterus Measurements: 11.5 x 4.4 x 4.5 cm, anteverted and anteflexed. No fibroids or other mass visualized. Nabothian cysts in the cervix. Endometrium Thickness: 7 mm.  No focal abnormality visualized. Right ovary Measurements: 4.5 x 1.4 x 3.1 cm. Normal blood  flow seen. There is a probable collapsed complex cyst measuring 2.2 cm corresponding to that seen on CT. Additional physiologic follicles. Left ovary Measurements: 3.0 x 1.7 x 3.0 cm. Normal appearance/no adnexal mass. Physiologic follicles, normal blood flow. Other findings Small volume pelvic free fluid, likely physiologic. IMPRESSION: Complex cyst in the right ovary measuring 2.2 cm. This is likely a collapsed corpus luteal or hemorrhagic cyst. Given right-sided pelvic symptoms, follow-up ultrasound in 6-12 weeks could be considered, preferably in the week following the patient's normal menses. Normal sonographic appearance of the left ovary and uterus. Electronically Signed   By: Rubye OaksMelanie  Ehinger M.D.   On: 09/21/2015 18:05   Ct Abdomen Pelvis W Contrast  09/21/2015  CLINICAL DATA:  Emesis and RIGHT lower quadrant pain since Friday, hematemesis, prior appendectomy EXAM: CT ABDOMEN AND PELVIS WITH CONTRAST TECHNIQUE: Multidetector CT imaging of the abdomen and pelvis was performed using the standard protocol following bolus administration of intravenous contrast. Sagittal and coronal MPR images reconstructed from axial data set. CONTRAST:  100mL ISOVUE-300 IOPAMIDOL (ISOVUE-300) INJECTION 61% IV. No oral contrast administered. COMPARISON:  None FINDINGS: Lower chest:  Lung bases clear Hepatobiliary: Liver and gallbladder normal appearance Pancreas: Normal appearance Spleen: Normal appearance Adrenals/Urinary Tract: Adrenal glands, kidneys, ureters, and bladder normal appearance. No urinary tract calcification or dilatation. Stomach/Bowel: Appendix surgically absent by history. Suboptimal assessment of bowel loops due the lack of IV contrast. Minimal edema within mesenteric in RIGHT mid abdomen. No definite obstruction, bowel dilatation or bowel wall thickening. Vascular/Lymphatic: Major vascular structures grossly patent. Scattered normal size mesenteric nodes without definite abdominal or pelvic adenopathy.  Reproductive: Unremarkable uterus and LEFT ovary. Probable partially collapsed cyst in RIGHT ovary 20 x 15 x 15 mm with free fluid in pelvis. Other: No free air or hernia. Musculoskeletal: Osseous structures unremarkable. IMPRESSION: Small amount of low-attenuation free pelvic fluid with question ruptured/collapsed cyst in RIGHT ovary 20 x 15 x 15 mm in size. Minimal scattered nonspecific edema within mesentery in the upper RIGHT pelvis. No other definite abdominal or pelvic abnormalities. Electronically Signed   By: Ulyses SouthwardMark  Boles M.D.   On: 09/21/2015 16:04   [place] Abdomen Limited Ruq  09/22/2015  CLINICAL DATA:  Five day history of right upper quadrant pain EXAM: US ABDOMEN LIMITED - RIGHT UPPER QUADRANT COMPARISON:  None. FINDINGS: Gallbladder: There are no echogenic foci in the gallbladder to indicate gallstones. The gallbladder wall thickness is upper normal with subtle edematous change in the wall. No sonographic Murphy sign noted by sonographer. There is a septation in the gallbladder, an anatomic variant. Common bile duct: Diameter: 2 mm. No intrahepatic or extrahepatic biliary duct dilatation. Liver: No focal lesion identified. Within normal limits  in parenchymal echogenicity. IMPRESSION: No gallstones evident. Gallbladder wall appears subtly edematous, however. This finding potentially could indicate early acalculus cholecystitis. Given this circumstance, it may be prudent to consider nuclear medicine hepatobiliary imaging study to assess for cystic duct patency. Study otherwise unremarkable. Electronically Signed   By: Bretta Bang III M.D.   On: 09/22/2015 16:29      Assessment/Plan  . Epigastric abdominal pain:  Unclear etiology to me yet but suspect gastroenteritis or Cholecystitis  . No evidence of pancreatitis seen on CT and lipase level normal.   RUQ [place] with gallbladder wall edema, will check HIDA scan, keep patient NPO. Patient has felt better but still nauseated. I feel early discharge  today may cause her to come back. Awaiting HIDA scan results will try advancing diet after.    Possible ruptured cyst is symptomatic but less likely to present like this esp with epigastric pain.   . Paroxysmal SVT (supraventricular tachycardia) [facility]): Patient has had an EP study already with decisions to be made as far as further intervention later on  . Constipation: Sounds to be a recent problem although it is now better. Do not think this is a contributor factor given that her biggest complaint is of pain rather than nausea or fullness. Abdominal exam does not show distention. CT exam does not show large amount of stool.   . Nausea with vomiting: As mentioned above, maybe earlier been viral gastroenteritis. We'll check stool cultures. Continue Zofran prn, morphine prn, IVF.  DVT prophylaxis: Lovenox  Code Status: Full code   Family Communication: none at the bedside   Disposition Plan: Likely discharge once patient tolerating oral intake consistently  Consults called: None  Admission status: Observation   Penny Pia  M.D [facility]

## 2015-09-24 MED ORDER — HYDROCODONE-ACETAMINOPHEN 5-325 MG PO TABS: 2.0000 | ORAL_TABLET | ORAL | Status: DC | PRN

## 2015-09-24 MED ORDER — PROMETHAZINE HCL 25 MG PO TABS: 25.0000 mg | ORAL_TABLET | Freq: Four times a day (QID) | ORAL | Status: DC | PRN

## 2015-09-24 NOTE — Progress Notes (Signed)
Pt discharged home in stable condition. Discharge instructions and scripts given. Pt verbalized understanding. No immediate questions or concerns at this time.  

## 2015-09-24 NOTE — Discharge Summary (Addendum)
Physician Discharge Summary  Vicenta DunningCarrie Pluta ZOX:[id number] DOB: 03/23/1980 DOA: 09/21/2015  PCP: No primary care provider on file.  Admit date: 09/21/2015 Discharge date: 09/24/2015  Time spent: > 35 minutes  Recommendations for Outpatient Follow-up:  1. Suspecting acalculus cholecystitis and recommended a low fat diet   Discharge Diagnoses:  Principal Problem:   Epigastric abdominal pain Active Problems:   Paroxysmal SVT (supraventricular tachycardia) (HCC)   Constipation   Nausea with vomiting   Discharge Condition: stable  Diet recommendation: low fat  Filed Weights   09/21/15 1850  Weight: 55.7 kg (122 lb 12.7 oz)    History of present illness:  35 y.o. female with medical history significant constipation and paroxysmal SVT who was in her usual state of health when starting Friday evening after eating some pizza, she started experiencing sudden onset of midepigastric pain followed by relentless nausea, vomiting and diarrhea.   Hospital Course:  . Epigastric abdominal pain: Suspecting acalculous Cholecystitis  . No evidence of pancreatitis seen on CT and lipase level normal.  RUQ US with gallbladder wall edema - Hida scan reports a norma hpatobiliary study  Possible ruptured cyst is symptomatic but less likely to present like this esp with epigastric pain.   . Paroxysmal SVT (supraventricular tachycardia) [facility](HCC): Patient has had an EP study already with decisions to be made as far as further intervention later on  . Constipation: Sounds to be a recent problem although it is now better. Do not think this is a contributor factor given that her biggest complaint is of pain rather than nausea or fullness. Abdominal exam does not show distention. CT exam does not show large amount of stool.   . Nausea with vomiting: resolved with bowel rest and supportive therapy Will d/c with script for antiemetic  Procedures:  None  Consultations:  None  Discharge  Exam: Filed Vitals:   09/23/15 2123 09/24/15 0636  BP: 107/80 105/72  Pulse: 66 67  Temp: 98.7 F (37.1 C) 98.2 F (36.8 C)  Resp: 14 16    General: Pt in nad, alert and awake Cardiovascular: no cyanosis Respiratory: no increased wob, no wheezes  Discharge Instructions   Discharge Instructions    Diet - low sodium heart healthy    Complete by:  As directed      Discharge instructions    Complete by:  As directed   Please be sure to follow up with your primary care physician in 1-2 weeks or sooner should any new concerns arise.     Increase activity slowly    Complete by:  As directed           Current Discharge Medication List    CONTINUE these medications which have CHANGED   Details  HYDROcodone-acetaminophen (NORCO/VICODIN) 5-325 MG tablet Take 2 tablets by mouth every 4 (four) hours as needed for moderate pain. Qty: 12 tablet, Refills: 0    promethazine (PHENERGAN) 25 MG tablet Take 1 tablet (25 mg total) by mouth every 6 (six) hours as needed for nausea or vomiting. Qty: 10 tablet, Refills: 0      CONTINUE these medications which have NOT CHANGED   Details  hydrOXYzine (ATARAX/VISTARIL) 25 MG tablet Take 25 mg by mouth at bedtime as needed for nausea.      STOP taking these medications     nitrofurantoin (MACRODANTIN) 25 MG capsule      ondansetron (ZOFRAN) 4 MG tablet      omeprazole (PRILOSEC) 20 MG capsule  Allergies  Allergen Reactions  . Ciprofloxacin Rash      The results of significant diagnostics from this hospitalization (including imaging, microbiology, ancillary and laboratory) are listed below for reference.    Significant Diagnostic Studies: Nm Hepatobiliary Liver Func  09/23/2015  CLINICAL DATA:  Right upper quadrant pain common nausea vomiting for 5 days. EXAM: NUCLEAR MEDICINE HEPATOBILIARY IMAGING TECHNIQUE: Sequential images of the abdomen were obtained out to 60 minutes following intravenous administration of  radiopharmaceutical. RADIOPHARMACEUTICALS:  5.2 mCi Tc-59m  Choletec IV COMPARISON:  Ultrasound, 09/22/2015 FINDINGS: Prompt uptake and biliary excretion of activity by the liver is seen. Gallbladder activity is visualized, consistent with patency of cystic duct. Biliary activity passes into small bowel, consistent with patent common bile duct. IMPRESSION: Normal hepatobiliary study. Electronically Signed   By: Amie Portland M.D.   On: 09/23/2015 13:04   US Transvaginal Non-ob  09/21/2015  CLINICAL DATA:  Right lower quadrant/pelvic pain for 4 days. Ovarian cyst. EXAM: TRANSABDOMINAL AND TRANSVAGINAL ULTRASOUND OF PELVIS TECHNIQUE: Both transabdominal and transvaginal ultrasound examinations of the pelvis were performed. Transabdominal technique was performed for global imaging of the pelvis including uterus, ovaries, adnexal regions, and pelvic cul-de-sac. It was necessary to proceed with endovaginal exam following the transabdominal exam to visualize the left and right ovary. COMPARISON:  CT abdomen/ pelvis earlier this day FINDINGS: Uterus Measurements: 11.5 x 4.4 x 4.5 cm, anteverted and anteflexed. No fibroids or other mass visualized. Nabothian cysts in the cervix. Endometrium Thickness: 7 mm.  No focal abnormality visualized. Right ovary Measurements: 4.5 x 1.4 x 3.1 cm. Normal blood flow seen. There is a probable collapsed complex cyst measuring 2.2 cm corresponding to that seen on CT. Additional physiologic follicles. Left ovary Measurements: 3.0 x 1.7 x 3.0 cm. Normal appearance/no adnexal mass. Physiologic follicles, normal blood flow. Other findings Small volume pelvic free fluid, likely physiologic. IMPRESSION: Complex cyst in the right ovary measuring 2.2 cm. This is likely a collapsed corpus luteal or hemorrhagic cyst. Given right-sided pelvic symptoms, follow-up ultrasound in 6-12 weeks could be considered, preferably in the week following the patient's normal menses. Normal sonographic appearance  of the left ovary and uterus. Electronically Signed   By: Rubye Oaks M.D.   On: 09/21/2015 18:05   US Pelvis Complete  09/21/2015  CLINICAL DATA:  Right lower quadrant/pelvic pain for 4 days. Ovarian cyst. EXAM: TRANSABDOMINAL AND TRANSVAGINAL ULTRASOUND OF PELVIS TECHNIQUE: Both transabdominal and transvaginal ultrasound examinations of the pelvis were performed. Transabdominal technique was performed for global imaging of the pelvis including uterus, ovaries, adnexal regions, and pelvic cul-de-sac. It was necessary to proceed with endovaginal exam following the transabdominal exam to visualize the left and right ovary. COMPARISON:  CT abdomen/ pelvis earlier this day FINDINGS: Uterus Measurements: 11.5 x 4.4 x 4.5 cm, anteverted and anteflexed. No fibroids or other mass visualized. Nabothian cysts in the cervix. Endometrium Thickness: 7 mm.  No focal abnormality visualized. Right ovary Measurements: 4.5 x 1.4 x 3.1 cm. Normal blood flow seen. There is a probable collapsed complex cyst measuring 2.2 cm corresponding to that seen on CT. Additional physiologic follicles. Left ovary Measurements: 3.0 x 1.7 x 3.0 cm. Normal appearance/no adnexal mass. Physiologic follicles, normal blood flow. Other findings Small volume pelvic free fluid, likely physiologic. IMPRESSION: Complex cyst in the right ovary measuring 2.2 cm. This is likely a collapsed corpus luteal or hemorrhagic cyst. Given right-sided pelvic symptoms, follow-up ultrasound in 6-12 weeks could be considered, preferably in the week following the patient's  normal menses. Normal sonographic appearance of the left ovary and uterus. Electronically Signed   By: Rubye OaksMelanie  Ehinger M.D.   On: 09/21/2015 18:05   Ct Abdomen Pelvis W Contrast  09/21/2015  CLINICAL DATA:  Emesis and RIGHT lower quadrant pain since Friday, hematemesis, prior appendectomy EXAM: CT ABDOMEN AND PELVIS WITH CONTRAST TECHNIQUE: Multidetector CT imaging of the abdomen and pelvis was  performed using the standard protocol following bolus administration of intravenous contrast. Sagittal and coronal MPR images reconstructed from axial data set. CONTRAST:  100mL ISOVUE-300 IOPAMIDOL (ISOVUE-300) INJECTION 61% IV. No oral contrast administered. COMPARISON:  None FINDINGS: Lower chest:  Lung bases clear Hepatobiliary: Liver and gallbladder normal appearance Pancreas: Normal appearance Spleen: Normal appearance Adrenals/Urinary Tract: Adrenal glands, kidneys, ureters, and bladder normal appearance. No urinary tract calcification or dilatation. Stomach/Bowel: Appendix surgically absent by history. Suboptimal assessment of bowel loops due the lack of IV contrast. Minimal edema within mesenteric in RIGHT mid abdomen. No definite obstruction, bowel dilatation or bowel wall thickening. Vascular/Lymphatic: Major vascular structures grossly patent. Scattered normal size mesenteric nodes without definite abdominal or pelvic adenopathy. Reproductive: Unremarkable uterus and LEFT ovary. Probable partially collapsed cyst in RIGHT ovary 20 x 15 x 15 mm with free fluid in pelvis. Other: No free air or hernia. Musculoskeletal: Osseous structures unremarkable. IMPRESSION: Small amount of low-attenuation free pelvic fluid with question ruptured/collapsed cyst in RIGHT ovary 20 x 15 x 15 mm in size. Minimal scattered nonspecific edema within mesentery in the upper RIGHT pelvis. No other definite abdominal or pelvic abnormalities. Electronically Signed   By: Ulyses SouthwardMark  Boles M.D.   On: 09/21/2015 16:04   [place] Abdomen Limited Ruq  09/22/2015  CLINICAL DATA:  Five day history of right upper quadrant pain EXAM: US ABDOMEN LIMITED - RIGHT UPPER QUADRANT COMPARISON:  None. FINDINGS: Gallbladder: There are no echogenic foci in the gallbladder to indicate gallstones. The gallbladder wall thickness is upper normal with subtle edematous change in the wall. No sonographic Murphy sign noted by sonographer. There is a septation in the  gallbladder, an anatomic variant. Common bile duct: Diameter: 2 mm. No intrahepatic or extrahepatic biliary duct dilatation. Liver: No focal lesion identified. Within normal limits in parenchymal echogenicity. IMPRESSION: No gallstones evident. Gallbladder wall appears subtly edematous, however. This finding potentially could indicate early acalculus cholecystitis. Given this circumstance, it may be prudent to consider nuclear medicine hepatobiliary imaging study to assess for cystic duct patency. Study otherwise unremarkable. Electronically Signed   By: Bretta BangWilliam  Woodruff III M.D.   On: 09/22/2015 16:29    Microbiology: No results found for this or any previous visit (from the past 240 hour(s)).   Labs: Basic Metabolic Panel:  Recent Labs Lab 09/20/15 0616 09/21/15 1520  NA 140 139  K 3.8 3.7  CL 107 103  CO2 23 28  GLUCOSE 128* 91  BUN 12 8  CREATININE 0.82 0.87  CALCIUM 9.7 9.4   Liver Function Tests:  Recent Labs Lab 09/20/15 0616 09/21/15 1520  AST 26 28  ALT 21 24  ALKPHOS 46 44  BILITOT 0.5 0.6  PROT 8.1 8.1  ALBUMIN 4.8 5.1*    Recent Labs Lab 09/20/15 0616 09/21/15 1520  LIPASE 42 33   No results for input(s): AMMONIA in the last 168 hours. CBC:  Recent Labs Lab 09/20/15 0616 09/21/15 1520  WBC 9.4 6.9  NEUTROABS  --  5.3  HGB 14.4 14.7  HCT 42.0 40.9  MCV 87.9 86.1  PLT 178 158   Cardiac  Enzymes: No results for input(s): CKTOTAL, CKMB, CKMBINDEX, TROPONINI in the last 168 hours. BNP: BNP (last 3 results) No results for input(s): BNP in the last 8760 hours.  ProBNP (last 3 results) No results for input(s): PROBNP in the last 8760 hours.  CBG:  Recent Labs Lab 09/22/15 1347 09/22/15 1755  GLUCAP 59* 71       Signed:  Penny Pia MD.  [facility] 09/24/2015, 10:09 AM   Will recommend that patient f/u with general surgeon. She can have her pcp make her a referral to the General surgeon of their choice  Regana Kemple,  Pamala Hurry

## 2016-07-06 DIAGNOSIS — M79672 Pain in left foot: Secondary | ICD-10-CM

## 2016-07-06 DIAGNOSIS — X503XXA Overexertion from repetitive movements, initial encounter: Secondary | ICD-10-CM

## 2016-07-06 DIAGNOSIS — S86912A Strain of unspecified muscle(s) and tendon(s) at lower leg level, left leg, initial encounter: Secondary | ICD-10-CM | POA: Insufficient documentation

## 2016-07-06 DIAGNOSIS — M7672 Peroneal tendinitis, left leg: Secondary | ICD-10-CM

## 2016-07-06 HISTORY — DX: Pain in left foot: M79.672

## 2016-07-06 HISTORY — DX: Overexertion from repetitive movements, initial encounter: X50.3XXA

## 2016-07-06 HISTORY — DX: Peroneal tendinitis, left leg: M76.72

## 2017-01-18 IMAGING — US US TRANSVAGINAL NON-OB
1 series · 13 of 25 positions shown · non-contrast
Comparison: CT abdomen/ pelvis earlier this day

CLINICAL DATA: Right lower quadrant/pelvic pain for 4 days. Ovarian
cyst.

EXAM:
TRANSABDOMINAL AND TRANSVAGINAL ULTRASOUND OF PELVIS
TECHNIQUE: Both transabdominal and transvaginal ultrasound examinations of the
pelvis were performed. Transabdominal technique was performed for
global imaging of the pelvis including uterus, ovaries, adnexal
regions, and pelvic cul-de-sac. It was necessary to proceed with
endovaginal exam following the transabdominal exam to visualize the
left and right ovary.

[Series 1: us transvaginal non-ob · 0.20mm/px · 70 acquisitions, 13 frames shown]
[im 1/70]
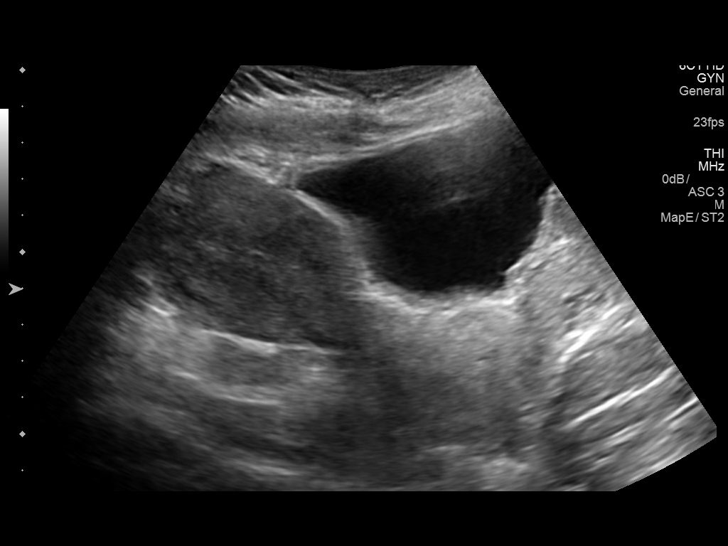
[im 6/70]
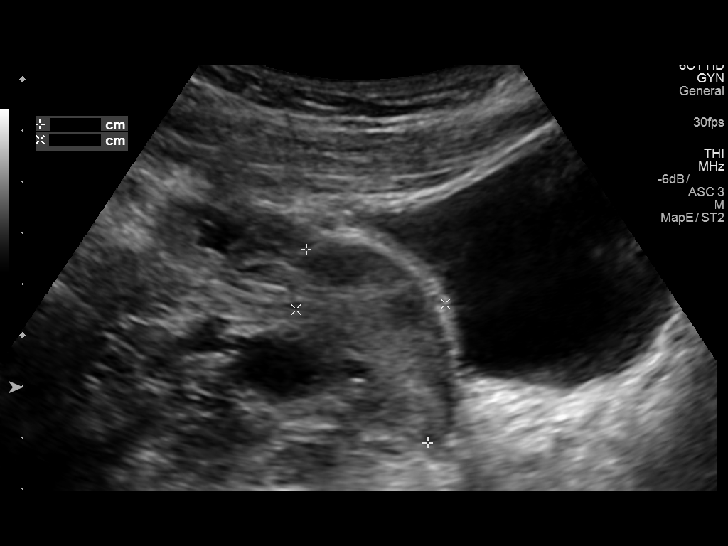
[im 12/70]
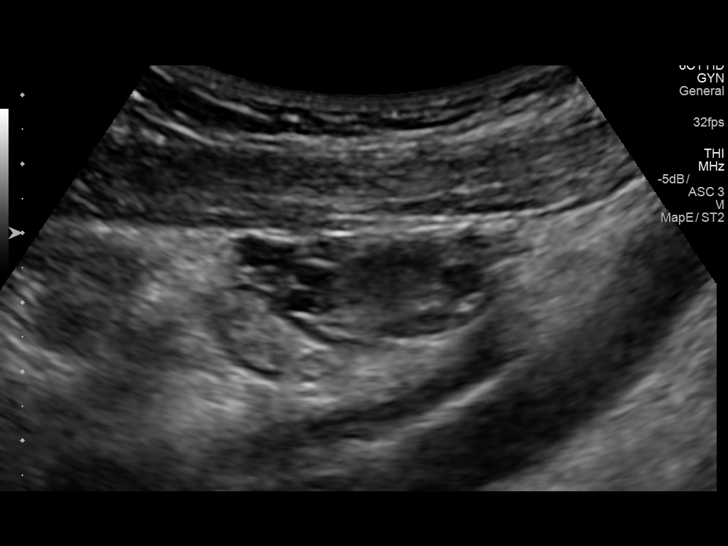
[im 18/70]
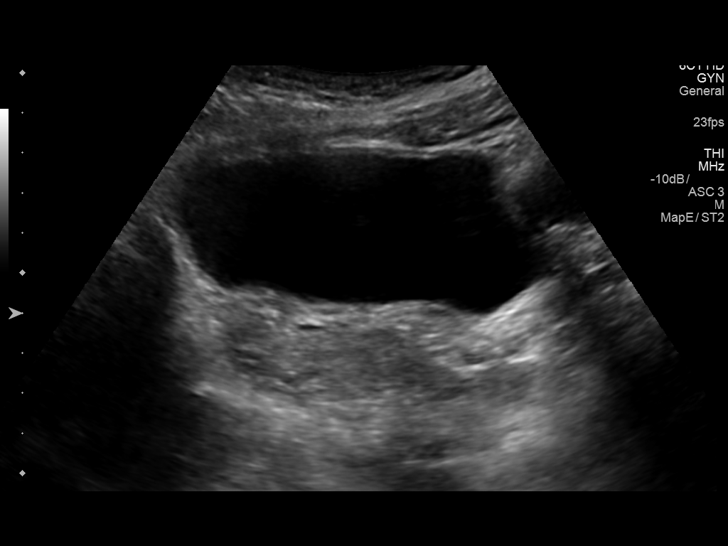
[im 24/70]
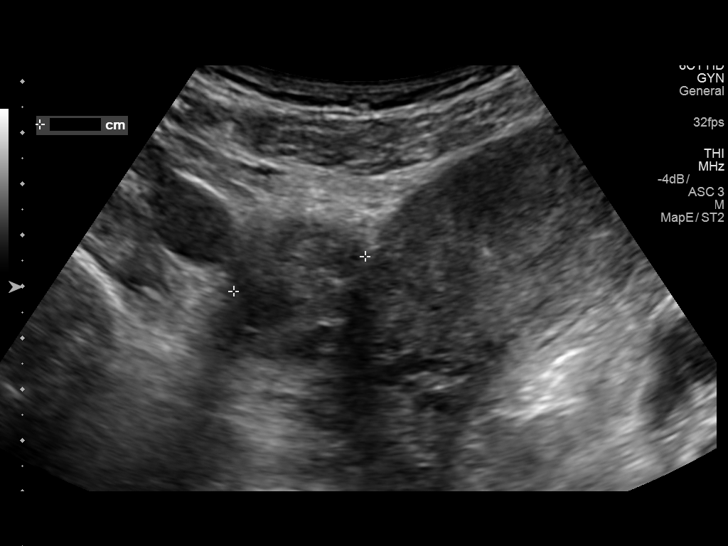
[im 29/70]
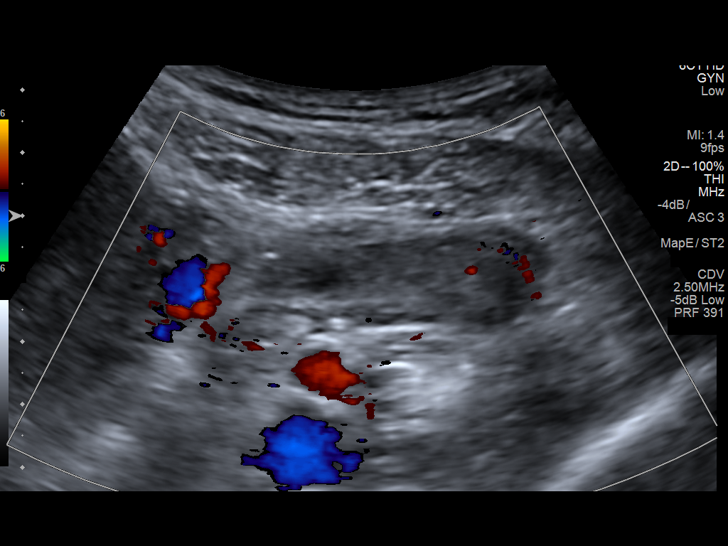
[im 35/70]
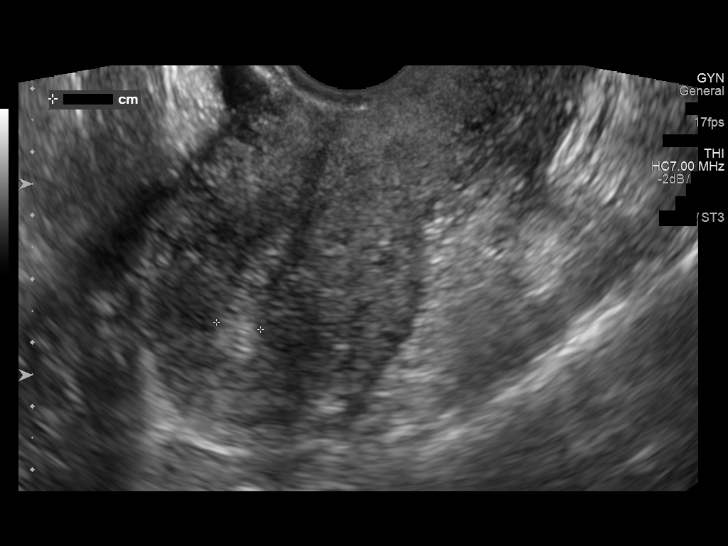
[im 41/70]
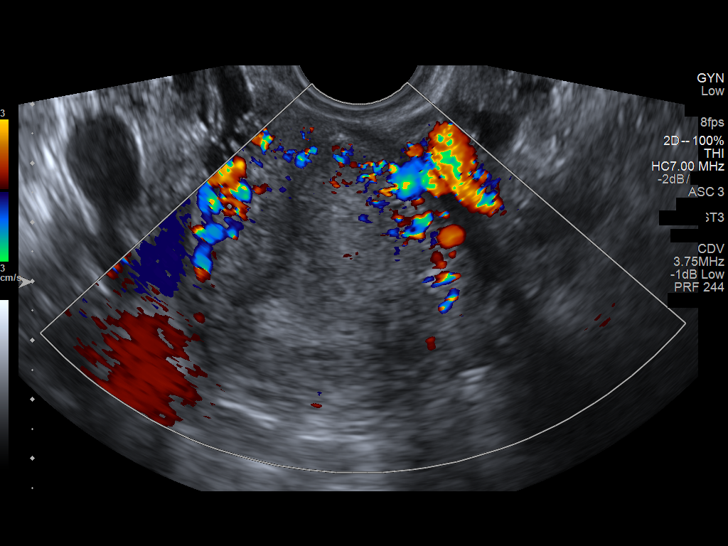
[im 47/70]
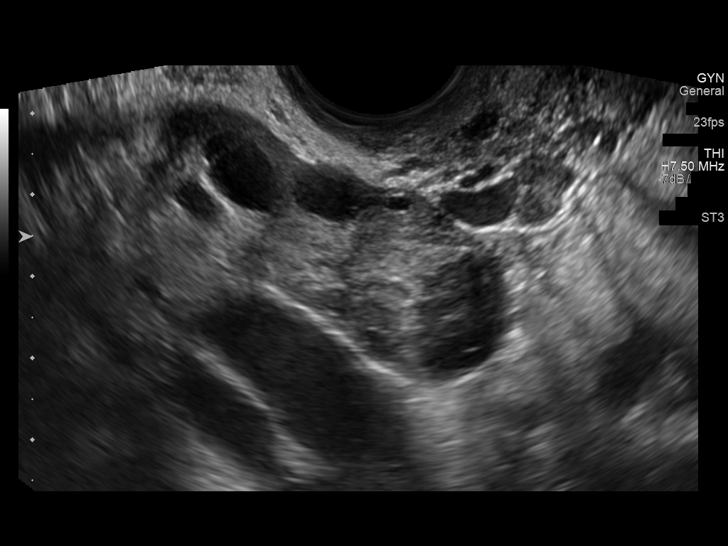
[im 52/70]
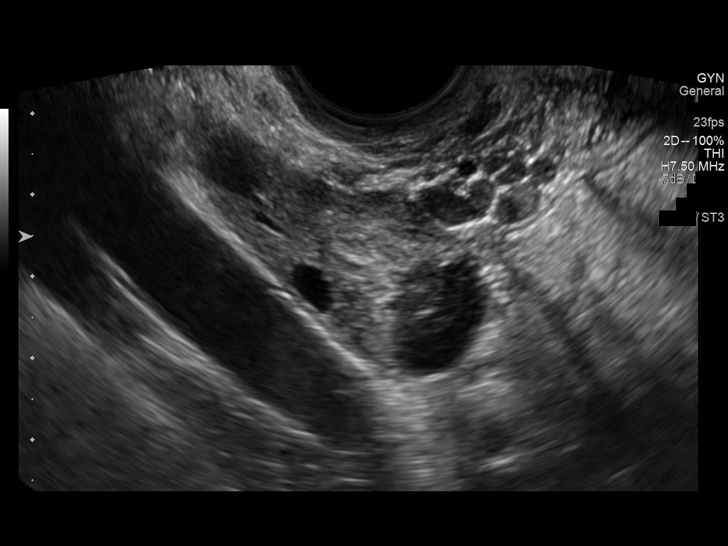
[im 58/70]
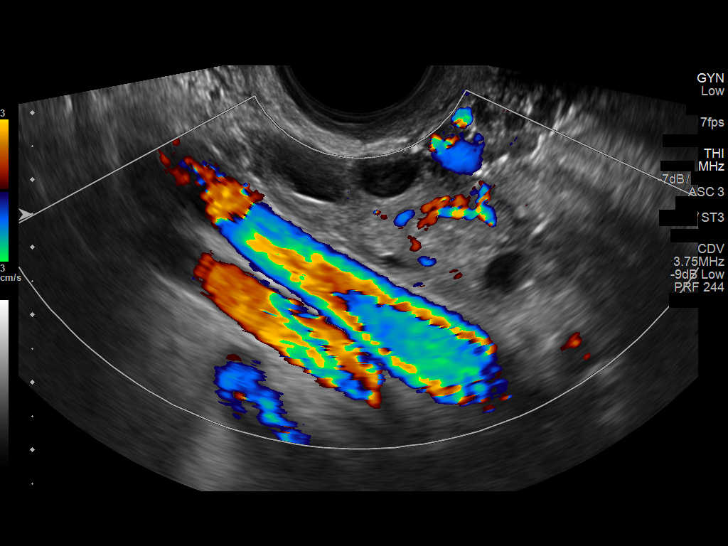
[im 64/70]
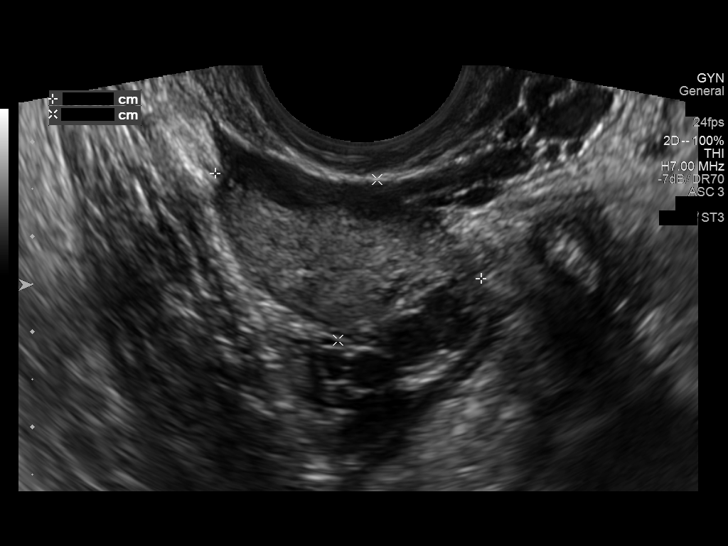
[im 70/70]
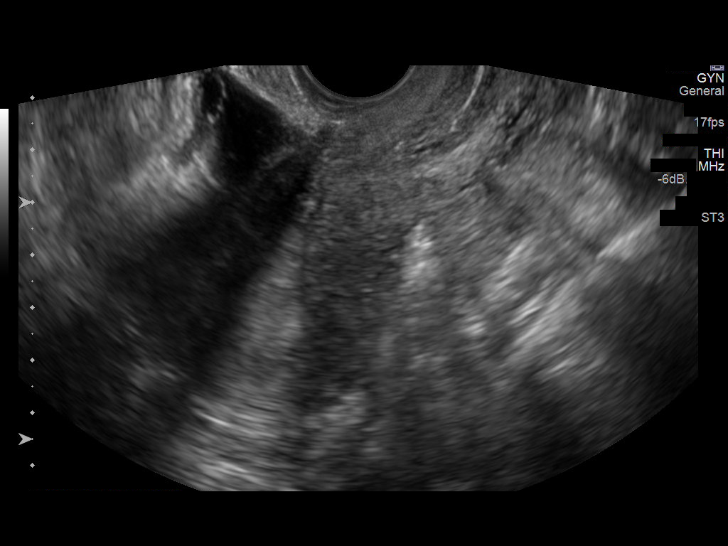

[13 of 25 positions shown; findings below may reference images not displayed]

FINDINGS: Uterus

Measurements: 11.5 x 4.4 x 4.5 cm, anteverted and anteflexed. No
fibroids or other mass visualized. Nabothian cysts in the cervix.

Endometrium

Thickness: 7 mm.  No focal abnormality visualized.

Right ovary

Measurements: 4.5 x 1.4 x 3.1 cm. Normal blood flow seen. There is a
probable collapsed complex cyst measuring 2.2 cm corresponding to
that seen on CT. Additional physiologic follicles.

Left ovary

Measurements: 3.0 x 1.7 x 3.0 cm. Normal appearance/no adnexal mass.
Physiologic follicles, normal blood flow.

Other findings

Small volume pelvic free fluid, likely physiologic.
IMPRESSION: Complex cyst in the right ovary measuring 2.2 cm. This is likely a
collapsed corpus luteal or hemorrhagic cyst. Given right-sided
pelvic symptoms, follow-up ultrasound in 6-12 weeks could be
considered, preferably in the week following the patient's normal
menses.

Normal sonographic appearance of the left ovary and uterus.

## 2017-01-19 IMAGING — US US ABDOMEN LIMITED
1 series · 14 of 25 positions shown · non-contrast
Comparison: None.

CLINICAL DATA: Five day history of right upper quadrant pain

EXAM:
US ABDOMEN LIMITED - RIGHT UPPER QUADRANT

[Series 1: us abdomen limited · 0.17mm/px · 14 of 112 slices shown]
[im 1/112]
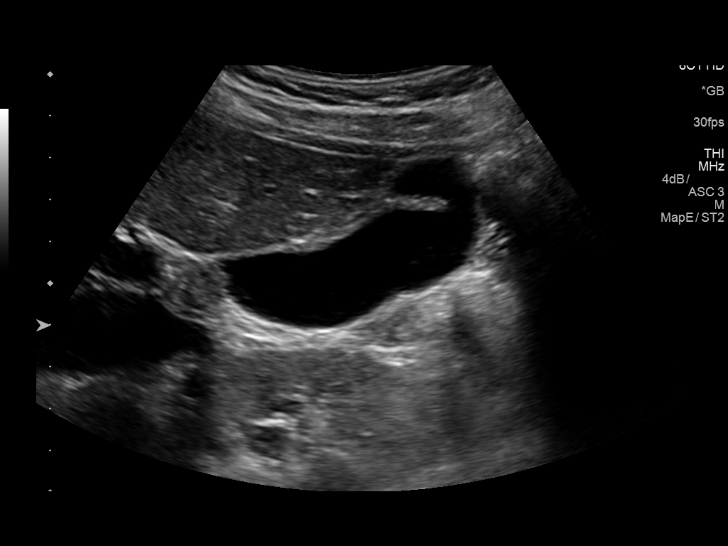
[im 10/112]
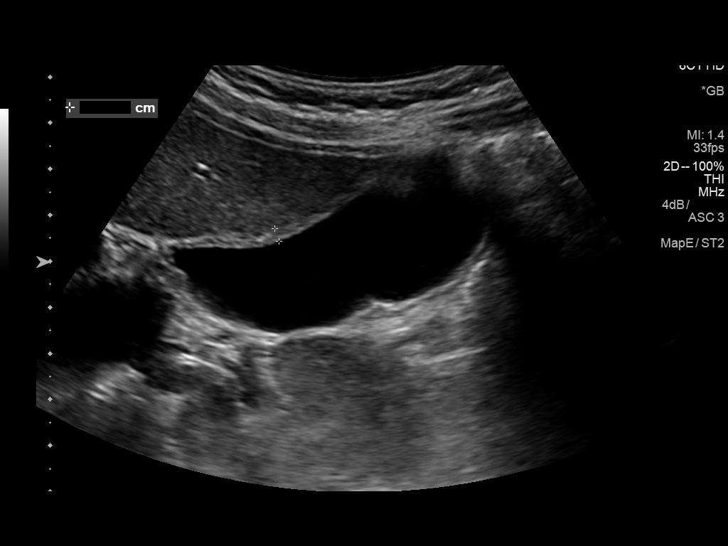
[im 19/112]
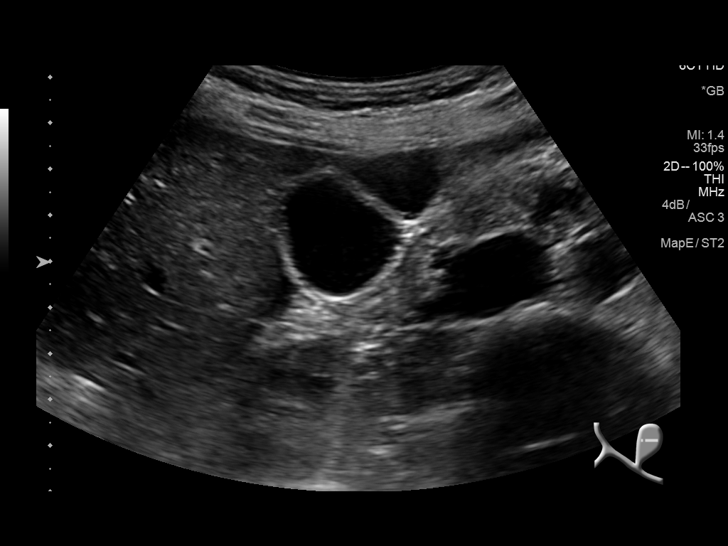
[im 28/112]
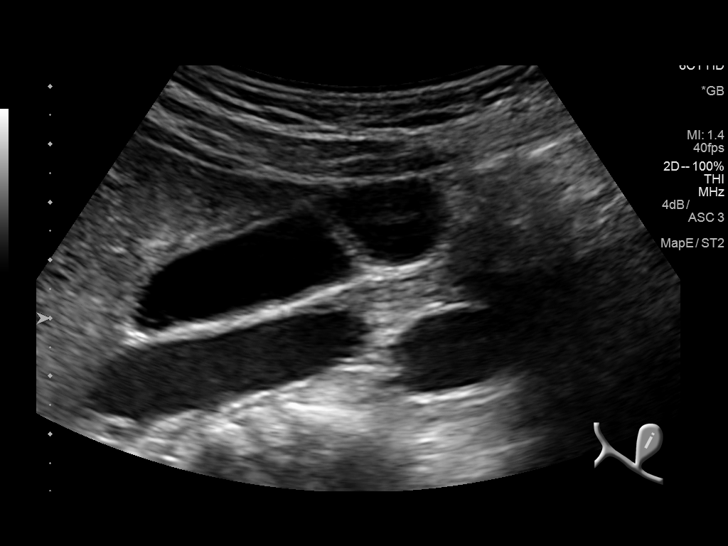
[im 38/112]
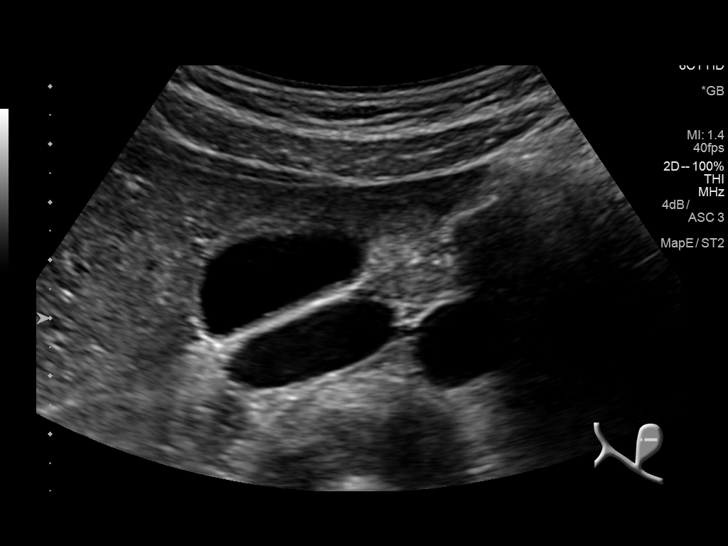
[im 42/112]
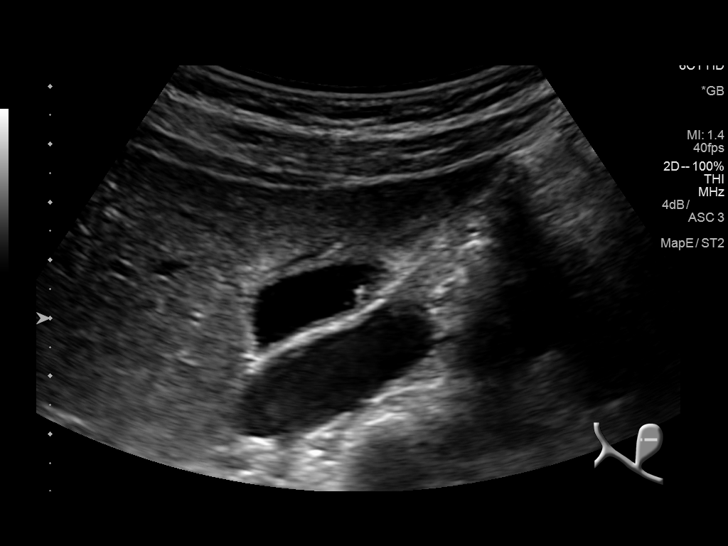
[im 51/112]
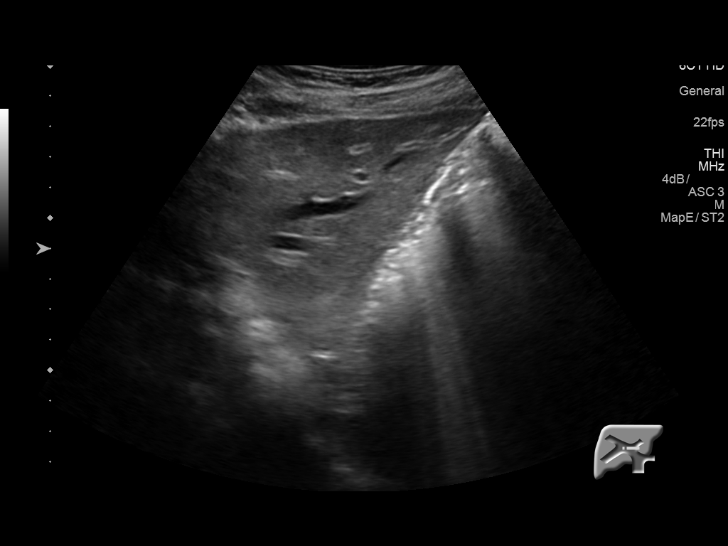
[im 61/112]
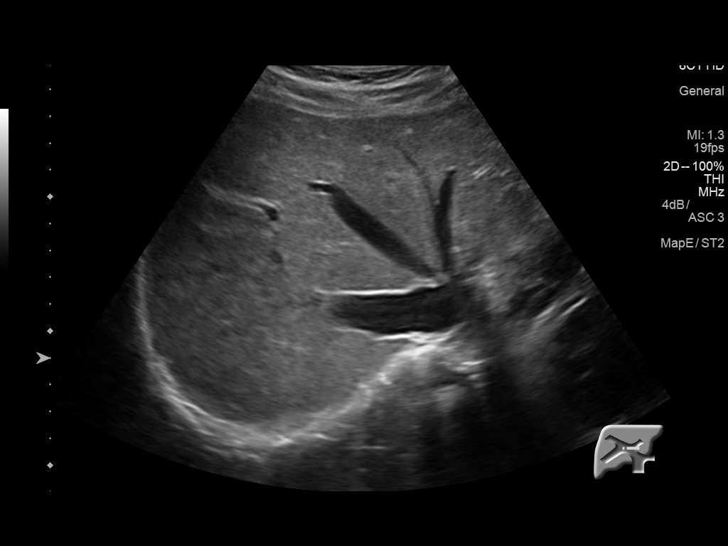
[im 70/112]
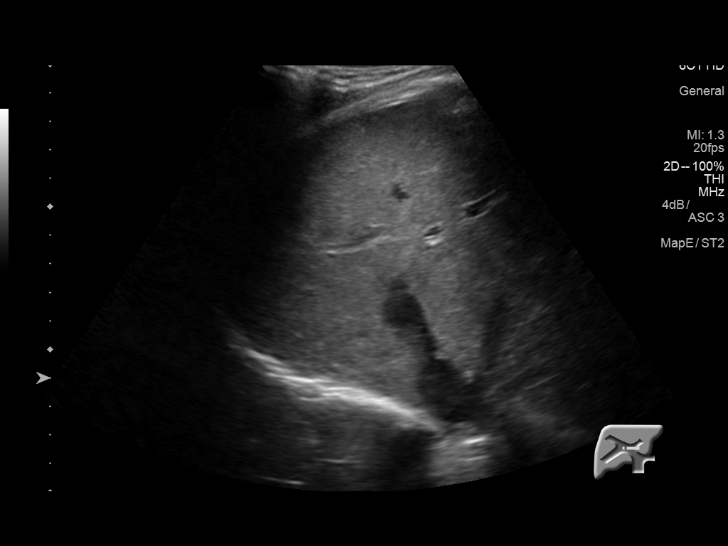
[im 75/112]
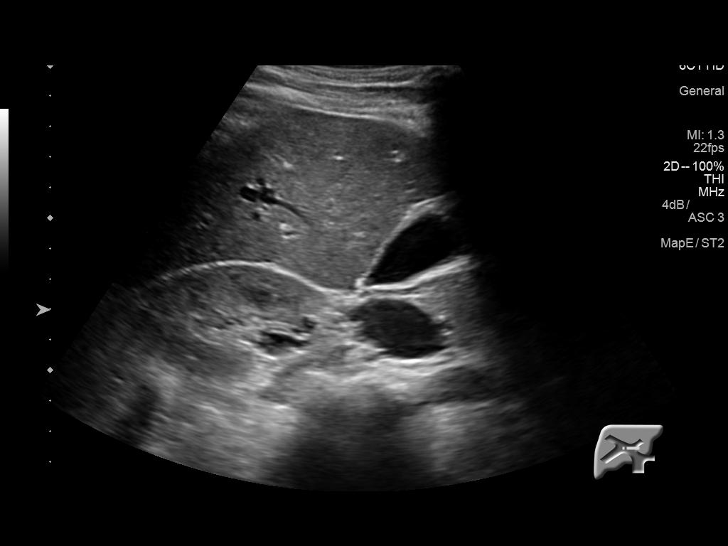
[im 84/112]
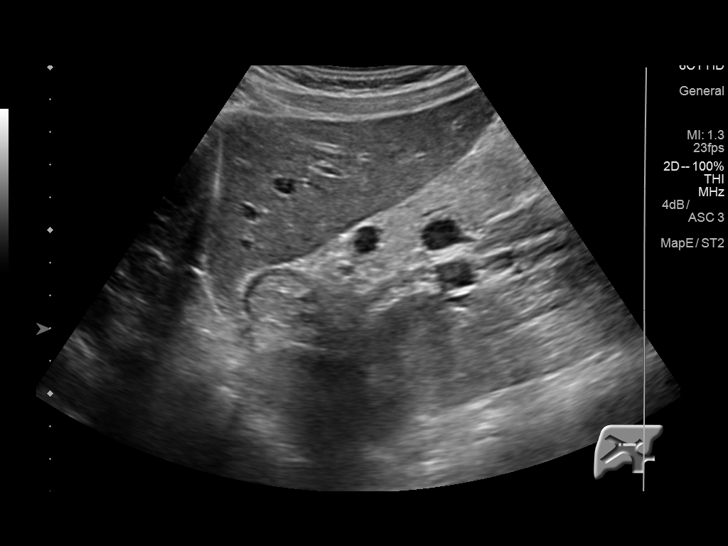
[im 93/112]
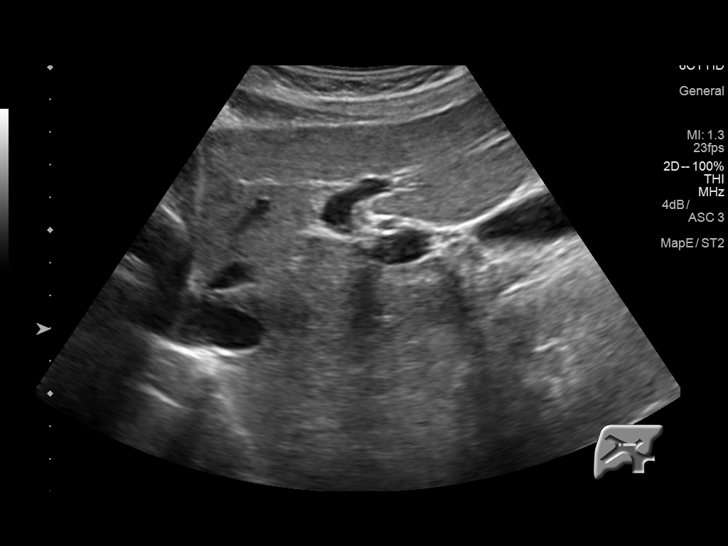
[im 102/112]
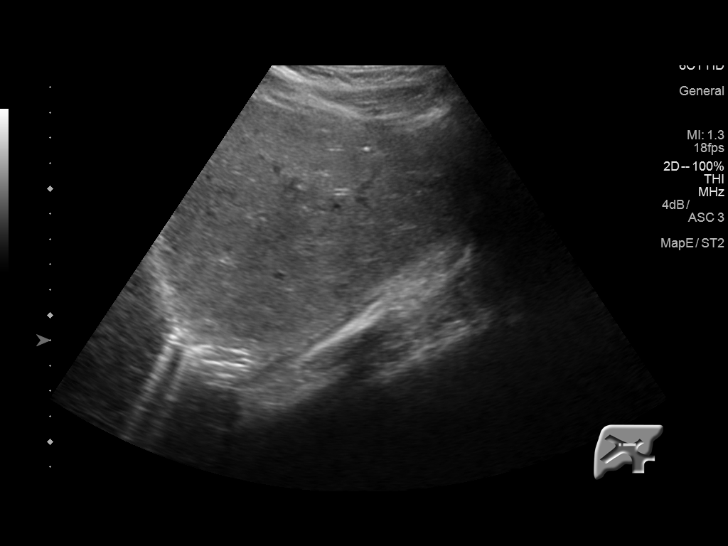
[im 112/112]
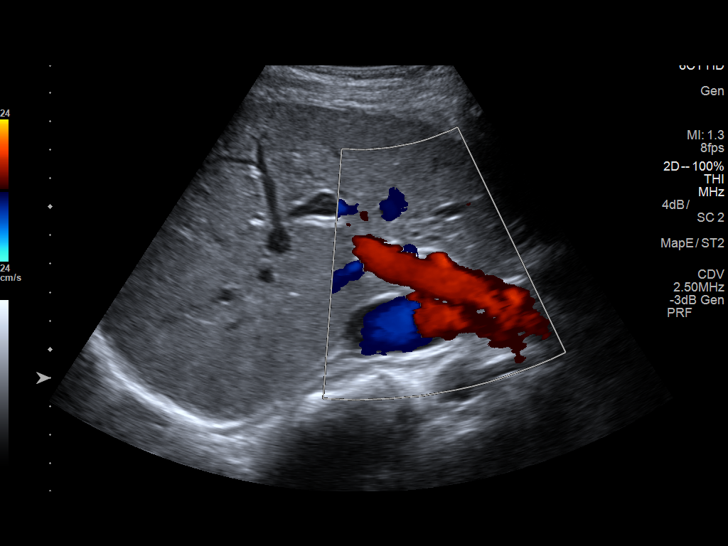

[14 of 25 positions shown; findings below may reference images not displayed]

FINDINGS: Gallbladder:

There are no echogenic foci in the gallbladder to indicate
gallstones. The gallbladder wall thickness is upper normal with
subtle edematous change in the wall. No sonographic Murphy sign
noted by sonographer. There is a septation in the gallbladder, an
anatomic variant.

Common bile duct:

Diameter: 2 mm. No intrahepatic or extrahepatic biliary duct
dilatation.

Liver:

No focal lesion identified. Within normal limits in parenchymal
echogenicity.
IMPRESSION: No gallstones evident. Gallbladder wall appears subtly edematous,
however. This finding potentially could indicate early acalculus
cholecystitis. Given this circumstance, it may be prudent to
consider nuclear medicine hepatobiliary imaging study to assess for
cystic duct patency. Study otherwise unremarkable.

## 2017-07-31 IMAGING — NM NM HEPATOBILIARY IMAGE, INC GB
1 series · 12 of 12 positions shown · non-contrast
Comparison: Ultrasound, 09/22/2015

CLINICAL DATA: Right upper quadrant pain common nausea vomiting for
5 days.

EXAM:
NUCLEAR MEDICINE HEPATOBILIARY IMAGING
TECHNIQUE: Sequential images of the abdomen were obtained [DATE] minutes
following intravenous administration of radiopharmaceutical.
RADIOPHARMACEUTICALS:  5.2 mCi Ic-77m  Choletec IV

[Series 1: hepato · 4.46mm/px · 2 acquisitions, 12 frames shown]
[im 1/2]
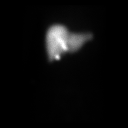
[im 1/2]
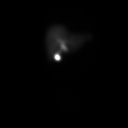
[im 1/2]
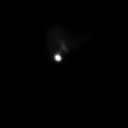
[im 1/2]
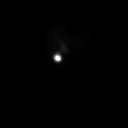
[im 1/2]
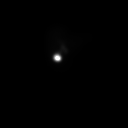
[im 1/2]
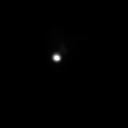
[im 2/2]
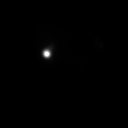
[im 2/2]
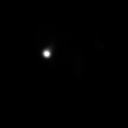
[im 2/2]
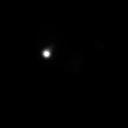
[im 2/2]
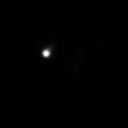
[im 2/2]
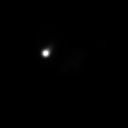
[im 2/2]
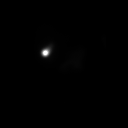

[12 of 12 positions shown; findings below may reference images not displayed]

FINDINGS: Prompt uptake and biliary excretion of activity by the liver is
seen. Gallbladder activity is visualized, consistent with patency of
cystic duct. Biliary activity passes into small bowel, consistent
with patent common bile duct.
IMPRESSION: Normal hepatobiliary study.

## 2017-08-22 DIAGNOSIS — K429 Umbilical hernia without obstruction or gangrene: Secondary | ICD-10-CM

## 2017-08-22 HISTORY — DX: Umbilical hernia without obstruction or gangrene: K42.9

## 2017-09-06 DIAGNOSIS — Z01818 Encounter for other preprocedural examination: Secondary | ICD-10-CM

## 2017-10-10 ENCOUNTER — Other Ambulatory Visit (HOSPITAL_COMMUNITY)
Admission: RE | Admit: 2017-10-10 | Discharge: 2017-10-10 | Disposition: A | Discharge status: Home / Self Care | Payer: Self-pay | Source: Ambulatory Visit | Attending: Obstetrics and Gynecology | Admitting: Obstetrics and Gynecology

## 2017-10-10 ENCOUNTER — Other Ambulatory Visit: Payer: Self-pay | Admitting: Obstetrics and Gynecology

## 2017-10-10 DIAGNOSIS — Z01411 Encounter for gynecological examination (general) (routine) with abnormal findings: Secondary | ICD-10-CM | POA: Insufficient documentation

## 2017-10-12 LAB — CYTOLOGY - PAP
DIAGNOSIS: NEGATIVE
HPV: NOT DETECTED

## 2018-12-20 ENCOUNTER — Encounter (HOSPITAL_COMMUNITY): Payer: Self-pay

## 2018-12-20 ENCOUNTER — Emergency Department (HOSPITAL_COMMUNITY)
Admission: EM | Admit: 2018-12-20 | Discharge: 2018-12-20 | Disposition: A | Discharge status: Home / Self Care | Payer: Self-pay | Attending: Emergency Medicine | Admitting: Emergency Medicine

## 2018-12-20 ENCOUNTER — Other Ambulatory Visit: Payer: Self-pay

## 2018-12-20 ENCOUNTER — Emergency Department (HOSPITAL_COMMUNITY): Payer: Self-pay

## 2018-12-20 DIAGNOSIS — Z20822 Contact with and (suspected) exposure to covid-19: Secondary | ICD-10-CM

## 2018-12-20 DIAGNOSIS — R509 Fever, unspecified: Secondary | ICD-10-CM | POA: Insufficient documentation

## 2018-12-20 DIAGNOSIS — R519 Headache, unspecified: Secondary | ICD-10-CM | POA: Insufficient documentation

## 2018-12-20 DIAGNOSIS — R197 Diarrhea, unspecified: Secondary | ICD-10-CM | POA: Insufficient documentation

## 2018-12-20 DIAGNOSIS — Z20828 Contact with and (suspected) exposure to other viral communicable diseases: Secondary | ICD-10-CM | POA: Insufficient documentation

## 2018-12-20 DIAGNOSIS — M7918 Myalgia, other site: Secondary | ICD-10-CM | POA: Insufficient documentation

## 2018-12-20 DIAGNOSIS — B349 Viral infection, unspecified: Secondary | ICD-10-CM | POA: Insufficient documentation

## 2018-12-20 LAB — URINALYSIS, ROUTINE W REFLEX MICROSCOPIC
Bilirubin Urine: NEGATIVE
Glucose, UA: NEGATIVE mg/dL
Hgb urine dipstick: NEGATIVE
Ketones, ur: 80 mg/dL — AB
Leukocytes,Ua: NEGATIVE
Nitrite: NEGATIVE
Protein, ur: NEGATIVE mg/dL
Specific Gravity, Urine: 1.014 (ref 1.005–1.030)
pH: 6 (ref 5.0–8.0)

## 2018-12-20 LAB — COMPREHENSIVE METABOLIC PANEL
ALT: 23 U/L (ref 0–44)
AST: 32 U/L (ref 15–41)
Albumin: 4.3 g/dL (ref 3.5–5.0)
Alkaline Phosphatase: 51 U/L (ref 38–126)
Anion gap: 13 (ref 5–15)
BUN: 13 mg/dL (ref 6–20)
CO2: 21 mmol/L — ABNORMAL LOW (ref 22–32)
Calcium: 9.7 mg/dL (ref 8.9–10.3)
Chloride: 104 mmol/L (ref 98–111)
Creatinine, Ser: 0.97 mg/dL (ref 0.44–1.00)
GFR calc Af Amer: >60 mL/min (ref >60)
GFR calc non Af Amer: >60 mL/min (ref >60)
Glucose, Bld: 91 mg/dL (ref 70–99)
Potassium: 3.7 mmol/L (ref 3.5–5.1)
Sodium: 138 mmol/L (ref 135–145)
Total Bilirubin: 1.1 mg/dL (ref 0.3–1.2)
Total Protein: 6.8 g/dL (ref 6.5–8.1)

## 2018-12-20 LAB — CBC WITH DIFFERENTIAL/PLATELET
Abs Immature Granulocytes: 0 10*3/uL (ref 0.00–0.07)
Basophils Absolute: 0 10*3/uL (ref 0.0–0.1)
Basophils Relative: 0 %
Eosinophils Absolute: 0 10*3/uL (ref 0.0–0.5)
Eosinophils Relative: 1 %
HCT: 41.2 % (ref 36.0–46.0)
Hemoglobin: 14.7 g/dL (ref 12.0–15.0)
Immature Granulocytes: 0 %
Lymphocytes Relative: 11 %
Lymphs Abs: 0.4 10*3/uL — ABNORMAL LOW (ref 0.7–4.0)
MCH: 31.6 pg (ref 26.0–34.0)
MCHC: 35.7 g/dL (ref 30.0–36.0)
MCV: 88.6 fL (ref 80.0–100.0)
Monocytes Absolute: 0 10*3/uL — ABNORMAL LOW (ref 0.1–1.0)
Monocytes Relative: 1 %
Neutro Abs: 3.4 10*3/uL (ref 1.7–7.7)
Neutrophils Relative %: 87 %
Platelets: 149 10*3/uL — ABNORMAL LOW (ref 150–400)
RBC: 4.65 MIL/uL (ref 3.87–5.11)
RDW: 12.8 % (ref 11.5–15.5)
WBC: 3.9 10*3/uL — ABNORMAL LOW (ref 4.0–10.5)
nRBC: 0 % (ref 0.0–0.2)

## 2018-12-20 LAB — TROPONIN I (HIGH SENSITIVITY): Troponin I (High Sensitivity): 10 ng/L (ref <18)

## 2018-12-20 LAB — LACTIC ACID, PLASMA
Lactic Acid, Venous: 1.5 mmol/L (ref 0.5–1.9)
Lactic Acid, Venous: 1 mmol/L (ref 0.5–1.9)

## 2018-12-20 LAB — I-STAT BETA HCG BLOOD, ED (MC, WL, AP ONLY): I-stat hCG, quantitative: <5 m[IU]/mL (ref <5)

## 2018-12-20 MED ORDER — ACETAMINOPHEN 500 MG PO TABS
1000.0000 mg | ORAL_TABLET | Freq: Once | ORAL | Status: AC
Start: 2018-12-20 — End: 2018-12-20
  Administered 2018-12-20: 1000 mg via ORAL
  Filled 2018-12-20: qty 2

## 2018-12-20 MED ORDER — SODIUM CHLORIDE 0.9% FLUSH: 3.0000 mL | Freq: Once | INTRAVENOUS | Status: DC

## 2018-12-20 MED ORDER — KETOROLAC TROMETHAMINE 30 MG/ML IJ SOLN
30.0000 mg | Freq: Once | INTRAMUSCULAR | Status: AC
Start: 2018-12-20 — End: 2018-12-20
  Administered 2018-12-20: 30 mg via INTRAVENOUS
  Filled 2018-12-20: qty 1

## 2018-12-20 MED ORDER — SODIUM CHLORIDE 0.9 % IV BOLUS
1000.0000 mL | Freq: Once | INTRAVENOUS | Status: AC
  Administered 2018-12-20: 1000 mL via INTRAVENOUS

## 2018-12-20 MED ORDER — ONDANSETRON HCL 4 MG/2ML IJ SOLN
4.0000 mg | Freq: Once | INTRAMUSCULAR | Status: AC | PRN
  Administered 2018-12-20: 4 mg via INTRAVENOUS
  Filled 2018-12-20: qty 2

## 2018-12-20 NOTE — Discharge Instructions (Addendum)
You have been tested for coronavirus. Results should be available in the next 24 hours. You will get a phone call if results are positive.   Alternate between Tylenol and naproxen for headache / fever / body aches.  Albuterol every 4-6 hours as needed for shortness of breath.   Follow up with your primary care doctor.   Return to ER for new or worsening symptoms, any additional concerns.

## 2018-12-20 NOTE — ED Triage Notes (Signed)
Pt reports generalized body aches, headache, SOB for the past few days. Used husbands inhaler without relief of symptoms. Denies COVID symptoms.

## 2018-12-20 NOTE — ED Provider Notes (Signed)
[facility] EMERGENCY DEPARTMENT Provider Note   CSN: [id number] Arrival date & time: 12/20/18  1655     History   Chief Complaint Chief Complaint  Patient presents with   Shortness of Breath   Headache   Generalized Body Aches    HPI Donna Russo is a 38 y.o. female.     The history is provided by the patient and medical records. No language interpreter was used.  Shortness of Breath Associated symptoms: fever (Low grade) and headaches   Associated symptoms: no abdominal pain, no chest pain, no cough, no rash, no sore throat, no vomiting and no wheezing   Headache Associated symptoms: diarrhea, fever (Low grade), myalgias and nausea   Associated symptoms: no abdominal pain, no congestion, no cough, no dizziness, no numbness, no sore throat, no vomiting and no weakness    Donna Russo is a 38 y.o. female  with a PMH of paroxysmal SVT who presents to the Emergency Department complaining of headache which has been intermittent over the last 3 or 4 days.  Generalized body aches and feeling unwell started yesterday.  This morning, she started having several loose, nonbloody stools.  Denies abdominal pain.  She does endorse shortness of breath, describing them as episodes that come and go.  When she has an episode, it is associated with nausea, but she has not had any vomiting.  Denies any chest pain.  She states that she actually is able to breathe, but it just feels labored and difficult.  Her husband had an albuterol inhaler at home which she did take 2 puffs prior to arrival and reports that it did help her moderately.  She has been taking her temperature as she has felt feverish, but temperatures have been ranging from 97 to 99 F.  No temperatures over 100 degrees.  She denies any known coronavirus exposures or other sick contacts.  She did take Aleve as well which helped with her body aches.  Tylenol did not seem to be as effective.  Denies  cough, congestion or sore throat.  History reviewed. No pertinent past medical history.  Patient Active Problem List   Diagnosis Date Noted   Coffee ground emesis 09/21/2015   Epigastric abdominal pain 09/21/2015   Paroxysmal SVT (supraventricular tachycardia) (HCC) 09/21/2015   Constipation 09/21/2015   Nausea with vomiting 09/21/2015    Past Surgical History:  Procedure Laterality Date   APPENDECTOMY       OB History   No obstetric history on file.      Home Medications    Prior to Admission medications   Medication Sig Start Date End Date Taking? Authorizing Provider  HYDROcodone-acetaminophen (NORCO/VICODIN) 5-325 MG tablet Take 2 tablets by mouth every 4 (four) hours as needed for moderate pain. 09/24/15   Penny Pia, MD  hydrOXYzine (ATARAX/VISTARIL) 25 MG tablet Take 25 mg by mouth at bedtime as needed for nausea.    [provider]  promethazine (PHENERGAN) 25 MG tablet Take 1 tablet (25 mg total) by mouth every 6 (six) hours as needed for nausea or vomiting. 09/24/15   Penny Pia, MD    Family History No family history on file.  Social History Social History   Tobacco Use   Smoking status: Never Smoker  Substance Use Topics   Alcohol use: No   Drug use: No     Allergies   Ciprofloxacin   Review of Systems Review of Systems  Constitutional: Positive for chills and fever (Low  grade).  HENT: Negative for congestion and sore throat.   Eyes: Negative for visual disturbance.  Respiratory: Positive for shortness of breath. Negative for cough and wheezing.   Cardiovascular: Negative for chest pain, palpitations and leg swelling.  Gastrointestinal: Positive for diarrhea and nausea. Negative for abdominal pain and vomiting.  Musculoskeletal: Positive for myalgias. Negative for arthralgias and joint swelling.  Skin: Negative for rash.  Neurological: Positive for headaches. Negative for dizziness, syncope, weakness, light-headedness and  numbness.  All other systems reviewed and are negative.    Physical Exam Updated Vital Signs BP (!) 90/55    Pulse 88    Temp 97.9 F (36.6 C) (Oral)    Resp 20    LMP 12/16/2018 (Exact Date)    SpO2 96%   Physical Exam Vitals signs and nursing note reviewed.  Constitutional:      General: She is not in acute distress.    Appearance: She is well-developed.  HENT:     Head: Normocephalic and atraumatic.     Mouth/Throat:     Pharynx: Oropharynx is clear. No oropharyngeal exudate.  Neck:     Musculoskeletal: Neck supple.     Comments: No meningeal signs. Cardiovascular:     Rate and Rhythm: Normal rate and regular rhythm.     Heart sounds: Normal heart sounds. No murmur.  Pulmonary:     Effort: Pulmonary effort is normal. No respiratory distress.     Breath sounds: Normal breath sounds.     Comments: Lungs clear to auscultation bilaterally. Abdominal:     General: There is no distension.     Palpations: Abdomen is soft.     Comments: No abdominal tenderness.  Skin:    General: Skin is warm and dry.  Neurological:     General: No focal deficit present.     Mental Status: She is alert and oriented to person, place, and time.      ED Treatments / Results  Labs (all labs ordered are listed, but only abnormal results are displayed) Labs Reviewed  COMPREHENSIVE METABOLIC PANEL - Abnormal; Notable for the following components:      Result Value   CO2 21 (*)    All other components within normal limits  CBC WITH DIFFERENTIAL/PLATELET - Abnormal; Notable for the following components:   WBC 3.9 (*)    Platelets 149 (*)    Lymphs Abs 0.4 (*)    Monocytes Absolute 0.0 (*)    All other components within normal limits  URINALYSIS, ROUTINE W REFLEX MICROSCOPIC - Abnormal; Notable for the following components:   Ketones, ur 80 (*)    All other components within normal limits  SARS CORONAVIRUS 2 (TAT 6-24 HRS)  LACTIC ACID, PLASMA  LACTIC ACID, PLASMA  I-STAT BETA HCG  BLOOD, ED ([facility], [facility], [facility] ONLY)  TROPONIN I (HIGH SENSITIVITY)  TROPONIN I (HIGH SENSITIVITY)    EKG None  Radiology Dg Chest Portable 1 View  Result Date: 12/20/2018 CLINICAL DATA:  38 year old female with shortness of breath, body ache and headache. EXAM: PORTABLE CHEST 1 VIEW COMPARISON:  CT Abdomen and Pelvis 09/21/2015. FINDINGS: Portable AP upright view at 1834 hours. Lung volumes and mediastinal contours are within normal limits. Visualized tracheal air column is within normal limits. Allowing for portable technique the lungs are clear. No pneumothorax or pleural effusion. No osseous abnormality identified. Negative visible bowel gas pattern. IMPRESSION: Negative portable chest. Electronically Signed   By: Odessa FlemingH  Hall M.D.   On: 12/20/2018 19:01  Procedures Procedures (including critical care time)  Medications Ordered in ED Medications  sodium chloride flush (NS) 0.9 % injection 3 mL (3 mLs Intravenous Not Given 12/20/18 1905)  sodium chloride 0.9 % bolus 1,000 mL (0 mLs Intravenous Stopped 12/20/18 1952)  ondansetron (ZOFRAN) injection 4 mg (4 mg Intravenous Given 12/20/18 1756)  ketorolac (TORADOL) 30 MG/ML injection 30 mg (30 mg Intravenous Given 12/20/18 1956)  acetaminophen (TYLENOL) tablet 1,000 mg (1,000 mg Oral Given 12/20/18 1958)  sodium chloride 0.9 % bolus 1,000 mL (1,000 mLs Intravenous New Bag/Given 12/20/18 1953)     Initial Impression / Assessment and Plan / ED Course  I have reviewed the triage vital signs and the nursing notes.  Pertinent labs & imaging results that were available during my care of the patient were reviewed by me and considered in my medical decision making (see chart for details).       Aliani Caccavale is a 38 y.o. female who presents to ED for headache, generalized body aches, low-grade fever and shortness of breath which started 2 days ago and acutely worsened today.  She also started experiencing diarrhea today.  Afebrile and vital signs  stable on initial eval.  Oxygenating at 100% on room air.  Lungs are clear to auscultation bilaterally.  Chest x-ray clear without evidence of pneumonia or pneumothorax.  Labs reassuring. Sxs certainly concerning for coronavirus.  She was tested and encouraged quarantine until results are back.  She has maintained oxygen patient over 95% on room air during ER stay today.  She feels improved after fluids and symptomatic meds ER. Her father is a physician and told me that he has a nebulizer machine and albuterol that she can use, therefore not needing prescription for such.  We discussed further symptomatic home care instructions, follow-up care and return precautions.  Father and daughter feel comfortable with discharged home at this point.  All questions were answered.   Final Clinical Impressions(s) / ED Diagnoses   Final diagnoses:  Viral syndrome  Suspected 2019 novel coronavirus infection    ED Discharge Orders    None       Mikesha Migliaccio, Ozella Almond, PA-C 12/20/18 2130    Daleen Bo, MD 12/20/18 2241

## 2018-12-21 LAB — SARS CORONAVIRUS 2 (TAT 6-24 HRS): SARS Coronavirus 2: NEGATIVE

## 2019-07-02 DIAGNOSIS — M89371 Hypertrophy of bone, right ankle and foot: Secondary | ICD-10-CM | POA: Insufficient documentation

## 2023-04-17 ENCOUNTER — Encounter: Payer: Self-pay | Admitting: Cardiology

## 2023-04-17 NOTE — Progress Notes (Unsigned)
Cardiology Office Note:    Date:  04/17/2023   ID:  Donna Russo, DOB 1980-07-28, MRN [id number]  PCP:  Blane Ohara, MD  Cardiologist:  Norman Herrlich, MD   Referring MD: Blane Ohara, MD  ASSESSMENT:    No diagnosis found. PLAN:    In order of problems listed above:  ***  Next appointment   Medication Adjustments/Labs and Tests Ordered: Current medicines are reviewed at length with the patient today.  Concerns regarding medicines are outlined above.  No orders of the defined types were placed in this encounter.  No orders of the defined types were placed in this encounter.    No chief complaint on file. ***  History of Present Illness:    Donna Russo is a 43 y.o. female who is being seen today for the evaluation of *** at the request of Cox, Kirsten, MD.  She is previously seen by electrophysiology [facility] Dr. Luella Cook 05/25/2022 with a history of ectopic atrial tachycardia.  His office chart said that she had not had any episodes for several years.  She was not on any rate suppressant medication.  Her EKG showed a rhythm that was sinus 63 bpm that was normal.  Chart review shows that she had an echocardiogram in 2011 again at [facility] which was described as normal Holter monitor at that time showed sinus rhythm with periods of sinus tachycardia. No past medical history on file.  Past Surgical History:  Procedure Laterality Date   APPENDECTOMY      Current Medications: No outpatient medications have been marked as taking for the 04/18/23 encounter (Appointment) with Baldo Daub, MD.     Allergies:   Ciprofloxacin   Social History   Socioeconomic History   Marital status: Married    Spouse name: Not on file   Number of children: Not on file   Years of education: Not on file   Highest education level: Not on file  Occupational History   Not on file  Tobacco Use   Smoking status: Never   Smokeless tobacco:  Not on file  Substance and Sexual Activity   Alcohol use: No   Drug use: No   Sexual activity: Yes  Other Topics Concern   Not on file  Social History Narrative   Not on file   Social Drivers of Health   Financial Resource Strain: Not on file  Food Insecurity: Not on file  Transportation Needs: Not on file  Physical Activity: Not on file  Stress: Not on file  Social Connections: Not on file     Family History: The patient's ***family history is not on file.  ROS:   ROS Please see the history of present illness.    *** All other systems reviewed and are negative.  EKGs/Labs/Other Studies Reviewed:    The following studies were reviewed today: ***      EKG:  EKG is *** ordered today.  The ekg ordered today is personally reviewed and demonstrates ***  Recent Labs: No results found for requested labs within last 365 days.  Recent Lipid Panel No results found for: "CHOL", "TRIG", "HDL", "CHOLHDL", "VLDL", "LDLCALC", "LDLDIRECT"  Physical Exam:    VS:  There were no vitals taken for this visit.    Wt Readings from Last 3 Encounters:  09/21/15 122 lb 12.7 oz (55.7 kg)  09/20/15 122 lb (55.3 kg)     GEN: *** Well nourished, well developed in no acute distress  HEENT: Normal NECK: No JVD; No carotid bruits LYMPHATICS: No lymphadenopathy CARDIAC: ***RRR, no murmurs, rubs, gallops RESPIRATORY:  Clear to auscultation without rales, wheezing or rhonchi  ABDOMEN: Soft, non-tender, non-distended MUSCULOSKELETAL:  No edema; No deformity  SKIN: Warm and dry NEUROLOGIC:  Alert and oriented x 3 PSYCHIATRIC:  Normal affect     Signed, Norman Herrlich, MD  04/17/2023 7:59 AM    [facility]

## 2023-04-18 ENCOUNTER — Encounter: Payer: Self-pay | Admitting: Cardiology

## 2023-04-18 ENCOUNTER — Ambulatory Visit: Payer: Self-pay | Attending: Cardiology | Admitting: Cardiology

## 2023-04-18 VITALS — BP 120/76 | HR 88 | Ht 62.0 in | Wt 124.8 lb

## 2023-04-18 DIAGNOSIS — I4719 Other supraventricular tachycardia: Secondary | ICD-10-CM

## 2023-04-18 NOTE — Patient Instructions (Signed)
Medication Instructions:  Your physician recommends that you continue on your current medications as directed. Please refer to the Current Medication list given to you today.  *If you need a refill on your cardiac medications before your next appointment, please call your pharmacy*   Lab Work: None If you have labs (blood work) drawn today and your tests are completely normal, you will receive your results only by: MyChart Message (if you have MyChart) OR A paper copy in the mail If you have any lab test that is abnormal or we need to change your treatment, we will call you to review the results.   Testing/Procedures: None   Follow-Up: At [facility], you and your health needs are our priority.  As part of our continuing mission to provide you with exceptional heart care, we have created designated Provider Care Teams.  These Care Teams include your primary Cardiologist (physician) and Advanced Practice Providers (APPs -  Physician Assistants and Nurse Practitioners) who all work together to provide you with the care you need, when you need it.  We recommend signing up for the patient portal called "MyChart".  Sign up information is provided on this After Visit Summary.  MyChart is used to connect with patients for Virtual Visits (Telemedicine).  Patients are able to view lab/test results, encounter notes, upcoming appointments, etc.  Non-urgent messages can be sent to your provider as well.   To learn more about what you can do with MyChart, go to [email or web address].    Your next appointment:   1 year(s)  Provider:   Norman Herrlich, MD    Other Instructions None

## 2023-05-12 ENCOUNTER — Telehealth: Payer: Self-pay | Admitting: Cardiology

## 2023-05-12 NOTE — Telephone Encounter (Signed)
 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  Patient wants a call back on when and where she can pick up the letter.

## 2023-05-15 NOTE — Telephone Encounter (Signed)
 Called the patient and informed her of Dr. Hulen Shouts recommendation below:  "I put the letter as an addendum at the top of my office note.  Please print a copy the patient can pick it up or we can mail it to her or she can download through MyChart."  Patient verbalized understanding and she requested that I print out the letter on the top of her last office note and leave it at the front desk for her to pick-up. A copy of her last office note was left at the front desk for her to pick up. Patient had no further questions at this time.
